# Patient Record
Sex: Female | Born: 1935 | Race: White | Hispanic: No | Marital: Married | Smoking: Never smoker
Health system: Southern US, Community
[De-identification: ages and names within clinical notes are randomized; demographics above are authoritative.]

## PROBLEM LIST (undated history)

## (undated) DIAGNOSIS — E785 Hyperlipidemia, unspecified: Secondary | ICD-10-CM

## (undated) DIAGNOSIS — E039 Hypothyroidism, unspecified: Secondary | ICD-10-CM

## (undated) DIAGNOSIS — I639 Cerebral infarction, unspecified: Secondary | ICD-10-CM

## (undated) DIAGNOSIS — F0390 Unspecified dementia without behavioral disturbance: Secondary | ICD-10-CM

## (undated) HISTORY — PX: TOTAL HIP ARTHROPLASTY: SHX124

---

## 2000-03-03 ENCOUNTER — Encounter: Payer: Self-pay | Admitting: Internal Medicine

## 2000-03-03 ENCOUNTER — Encounter: Admission: RE | Admit: 2000-03-03 | Discharge: 2000-03-03 | Payer: Self-pay | Admitting: Internal Medicine

## 2000-03-16 ENCOUNTER — Other Ambulatory Visit: Admission: RE | Admit: 2000-03-16 | Discharge: 2000-03-16 | Payer: Self-pay | Admitting: Internal Medicine

## 2000-07-21 ENCOUNTER — Encounter: Payer: Self-pay | Admitting: Orthopaedic Surgery

## 2000-07-21 ENCOUNTER — Inpatient Hospital Stay (HOSPITAL_COMMUNITY): Admission: RE | Admit: 2000-07-21 | Discharge: 2000-07-27 | Payer: Self-pay | Admitting: Orthopaedic Surgery

## 2000-07-23 ENCOUNTER — Encounter: Payer: Self-pay | Admitting: Orthopaedic Surgery

## 2000-08-18 ENCOUNTER — Inpatient Hospital Stay (HOSPITAL_COMMUNITY): Admission: AD | Admit: 2000-08-18 | Discharge: 2000-08-22 | Payer: Self-pay | Admitting: Orthopaedic Surgery

## 2001-03-05 ENCOUNTER — Encounter: Admission: RE | Admit: 2001-03-05 | Discharge: 2001-03-05 | Payer: Self-pay | Admitting: Internal Medicine

## 2001-03-05 ENCOUNTER — Encounter: Payer: Self-pay | Admitting: Internal Medicine

## 2002-02-15 ENCOUNTER — Encounter: Payer: Self-pay | Admitting: Internal Medicine

## 2002-02-15 ENCOUNTER — Encounter: Admission: RE | Admit: 2002-02-15 | Discharge: 2002-02-15 | Payer: Self-pay | Admitting: Internal Medicine

## 2002-03-29 ENCOUNTER — Other Ambulatory Visit: Admission: RE | Admit: 2002-03-29 | Discharge: 2002-03-29 | Payer: Self-pay | Admitting: Internal Medicine

## 2003-02-24 ENCOUNTER — Encounter: Admission: RE | Admit: 2003-02-24 | Discharge: 2003-02-24 | Payer: Self-pay | Admitting: Internal Medicine

## 2003-02-24 ENCOUNTER — Encounter: Payer: Self-pay | Admitting: Internal Medicine

## 2003-03-16 ENCOUNTER — Other Ambulatory Visit: Admission: RE | Admit: 2003-03-16 | Discharge: 2003-03-16 | Payer: Self-pay | Admitting: Internal Medicine

## 2003-05-24 ENCOUNTER — Encounter: Payer: Self-pay | Admitting: *Deleted

## 2003-05-24 ENCOUNTER — Ambulatory Visit (HOSPITAL_COMMUNITY): Admission: RE | Admit: 2003-05-24 | Discharge: 2003-05-24 | Payer: Self-pay | Admitting: *Deleted

## 2004-02-26 ENCOUNTER — Ambulatory Visit (HOSPITAL_COMMUNITY): Admission: RE | Admit: 2004-02-26 | Discharge: 2004-02-26 | Payer: Self-pay | Admitting: Internal Medicine

## 2005-04-04 ENCOUNTER — Encounter: Admission: RE | Admit: 2005-04-04 | Discharge: 2005-04-04 | Payer: Self-pay | Admitting: Internal Medicine

## 2006-05-28 ENCOUNTER — Inpatient Hospital Stay (HOSPITAL_COMMUNITY): Admission: EM | Admit: 2006-05-28 | Discharge: 2006-05-30 | Payer: Self-pay | Admitting: Emergency Medicine

## 2006-05-28 ENCOUNTER — Ambulatory Visit (HOSPITAL_COMMUNITY): Admission: RE | Admit: 2006-05-28 | Discharge: 2006-05-28 | Payer: Self-pay | Admitting: Internal Medicine

## 2006-05-29 ENCOUNTER — Ambulatory Visit: Payer: Self-pay | Admitting: Cardiology

## 2006-06-01 ENCOUNTER — Ambulatory Visit: Payer: Self-pay | Admitting: Cardiology

## 2006-06-01 ENCOUNTER — Ambulatory Visit (HOSPITAL_COMMUNITY): Admission: RE | Admit: 2006-06-01 | Discharge: 2006-06-01 | Payer: Self-pay | Admitting: Cardiology

## 2006-06-03 ENCOUNTER — Encounter: Admission: RE | Admit: 2006-06-03 | Discharge: 2006-06-29 | Payer: Self-pay | Admitting: Nurse Practitioner

## 2006-06-06 ENCOUNTER — Inpatient Hospital Stay (HOSPITAL_COMMUNITY): Admission: EM | Admit: 2006-06-06 | Discharge: 2006-06-07 | Payer: Self-pay | Admitting: Emergency Medicine

## 2007-03-17 ENCOUNTER — Encounter: Admission: RE | Admit: 2007-03-17 | Discharge: 2007-03-17 | Payer: Self-pay | Admitting: Neurology

## 2011-01-05 ENCOUNTER — Encounter: Payer: Self-pay | Admitting: Internal Medicine

## 2011-01-30 ENCOUNTER — Emergency Department (HOSPITAL_COMMUNITY): Payer: Medicare Other

## 2011-01-30 ENCOUNTER — Observation Stay (HOSPITAL_COMMUNITY)
Admission: EM | Admit: 2011-01-30 | Discharge: 2011-01-31 | Disposition: A | Discharge status: Home / Self Care | Payer: Medicare Other | Attending: Internal Medicine | Admitting: Internal Medicine

## 2011-01-30 DIAGNOSIS — G309 Alzheimer's disease, unspecified: Secondary | ICD-10-CM | POA: Insufficient documentation

## 2011-01-30 DIAGNOSIS — S0100XA Unspecified open wound of scalp, initial encounter: Secondary | ICD-10-CM | POA: Insufficient documentation

## 2011-01-30 DIAGNOSIS — I69920 Aphasia following unspecified cerebrovascular disease: Secondary | ICD-10-CM | POA: Insufficient documentation

## 2011-01-30 DIAGNOSIS — E039 Hypothyroidism, unspecified: Secondary | ICD-10-CM | POA: Insufficient documentation

## 2011-01-30 DIAGNOSIS — E785 Hyperlipidemia, unspecified: Secondary | ICD-10-CM | POA: Insufficient documentation

## 2011-01-30 DIAGNOSIS — R55 Syncope and collapse: Principal | ICD-10-CM | POA: Insufficient documentation

## 2011-01-30 DIAGNOSIS — Z79899 Other long term (current) drug therapy: Secondary | ICD-10-CM | POA: Insufficient documentation

## 2011-01-30 DIAGNOSIS — F0280 Dementia in other diseases classified elsewhere without behavioral disturbance: Secondary | ICD-10-CM | POA: Insufficient documentation

## 2011-01-30 DIAGNOSIS — N390 Urinary tract infection, site not specified: Secondary | ICD-10-CM | POA: Insufficient documentation

## 2011-01-30 DIAGNOSIS — Y92009 Unspecified place in unspecified non-institutional (private) residence as the place of occurrence of the external cause: Secondary | ICD-10-CM | POA: Insufficient documentation

## 2011-01-30 DIAGNOSIS — W19XXXA Unspecified fall, initial encounter: Secondary | ICD-10-CM | POA: Insufficient documentation

## 2011-01-30 DIAGNOSIS — Z96649 Presence of unspecified artificial hip joint: Secondary | ICD-10-CM | POA: Insufficient documentation

## 2011-01-30 LAB — COMPREHENSIVE METABOLIC PANEL
AST: 28 U/L (ref 0–37)
Albumin: 4 g/dL (ref 3.5–5.2)
Alkaline Phosphatase: 60 U/L (ref 39–117)
BUN: 10 mg/dL (ref 6–23)
CO2: 27 mEq/L (ref 19–32)
Chloride: 108 mEq/L (ref 96–112)
GFR calc Af Amer: >60 mL/min (ref >60)
GFR calc non Af Amer: >60 mL/min (ref >60)
Glucose, Bld: 108 mg/dL — ABNORMAL HIGH (ref 70–99)
Potassium: 4 mEq/L (ref 3.5–5.1)
Sodium: 145 mEq/L (ref 135–145)
Total Protein: 6.9 g/dL (ref 6.0–8.3)

## 2011-01-30 LAB — URINE MICROSCOPIC-ADD ON

## 2011-01-30 LAB — URINALYSIS, ROUTINE W REFLEX MICROSCOPIC
Bilirubin Urine: NEGATIVE
Hgb urine dipstick: NEGATIVE
Protein, ur: NEGATIVE mg/dL
Specific Gravity, Urine: 1.013 (ref 1.005–1.030)
Urine Glucose, Fasting: NEGATIVE mg/dL
pH: 5 (ref 5.0–8.0)

## 2011-01-30 LAB — DIFFERENTIAL
Basophils Relative: 0 % (ref 0–1)
Eosinophils Absolute: 0 10*3/uL (ref 0.0–0.7)
Eosinophils Relative: 0 % (ref 0–5)
Lymphocytes Relative: 12 % (ref 12–46)
Lymphs Abs: 1.5 10*3/uL (ref 0.7–4.0)
Monocytes Absolute: 0.7 10*3/uL (ref 0.1–1.0)
Neutro Abs: 9.9 10*3/uL — ABNORMAL HIGH (ref 1.7–7.7)
Neutrophils Relative %: 82 % — ABNORMAL HIGH (ref 43–77)

## 2011-01-30 LAB — CBC
HCT: 38.4 % (ref 36.0–46.0)
MCH: 32.7 pg (ref 26.0–34.0)
MCHC: 33.1 g/dL (ref 30.0–36.0)
RDW: 12.6 % (ref 11.5–15.5)
WBC: 12.1 10*3/uL — ABNORMAL HIGH (ref 4.0–10.5)

## 2011-01-30 LAB — BASIC METABOLIC PANEL
Calcium: 9.3 mg/dL (ref 8.4–10.5)
Potassium: 3.8 mEq/L (ref 3.5–5.1)
Sodium: 144 mEq/L (ref 135–145)

## 2011-01-30 LAB — PROTIME-INR
INR: 0.93 (ref 0.00–1.49)
Prothrombin Time: 12.7 seconds (ref 11.6–15.2)

## 2011-01-30 LAB — TSH: TSH: 1.405 u[IU]/mL (ref 0.350–4.500)

## 2011-01-30 LAB — GLUCOSE, CAPILLARY: Glucose-Capillary: 105 mg/dL — ABNORMAL HIGH (ref 70–99)

## 2011-01-31 ENCOUNTER — Other Ambulatory Visit (HOSPITAL_COMMUNITY): Payer: Medicare Other

## 2011-01-31 DIAGNOSIS — R55 Syncope and collapse: Secondary | ICD-10-CM

## 2011-01-31 LAB — GLUCOSE, CAPILLARY: Glucose-Capillary: 105 mg/dL — ABNORMAL HIGH (ref 70–99)

## 2011-02-01 LAB — URINE CULTURE: Culture  Setup Time: 201202170146

## 2011-02-03 NOTE — H&P (Signed)
[name], [place]                ACCOUNT NO.:  [id number]  MEDICAL RECORD NO.:  [id number]           PATIENT TYPE:  I  LOCATION:  1426                         FACILITY:  [facility]  PHYSICIAN:  Hillery Aldo, M.D.   DATE OF BIRTH:  [birth year]  DATE OF ADMISSION:  01/30/2011 DATE OF DISCHARGE:                             HISTORY & PHYSICAL   PRIMARY CARE PHYSICIAN:  Massie Maroon, MD  CHIEF COMPLAINT:  Fall with head trauma.  HISTORY OF PRESENT ILLNESS:  The patient is an 77 year old female with past medical history of a left parietal stroke and Alzheimer dementia who presents to the hospital after suffering a syncopal episode.  The patient actually has no memory of the episode or the subsequent fall. She cannot say whether she had any symptoms prior to falling and it is unclear at this time if this was a mechanical fall or a syncopal episode.  The patient's husband heard a noise from the bathroom and found her wandering around.  She was unaware that she had fallen, but noticed that she was bleeding from the left side of her head.  The patient had a significant scalp laceration and was subsequently brought to the hospital for evaluation by her husband.  She is currently denying any dizziness, headache, speech changes, dysphagia, double vision, or focal neurologic deficits.  She also denies chest pain, dyspnea, nausea, vomiting, diarrhea, dysuria.  She has not noticed any increase in fatigue lately.  Upon initial evaluation in the emergency department, the scalp laceration was repaired with staples and she was referred to the hospitalist service for further inpatient evaluation.  PAST MEDICAL HISTORY: 1. Left parietal stroke with a history of aphasia. 2. Alzheimer dementia. 3. Dyslipidemia. 4. Hypothyroidism.  PAST SURGICAL HISTORY: 1. Right total hip replacement in 2001. 2. Hysterectomy in 1992. 3. Goiter surgery in 1977. 4. Status post bilateral cataract extractions with  lens implants.  FAMILY HISTORY:  The patient's parents died in their 31s of unknown causes.  She has 2 siblings who have had strokes.  Several of her siblings are deceased from cancer.  She has 2 daughters who have had breast cancer.  SOCIAL HISTORY:  The patient is married and lives with her husband.  She is a Advertising account planner.  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  She denies any history of tobacco, alcohol, or drug use.  ALLERGIES:  No known drug allergies.  CURRENT MEDICATIONS: 1. Aricept 23 mg p.o. daily. 2. Levothyroxine 0.25 mcg p.o. daily. 3. Aggrenox 1 capsule p.o. b.i.d. 4. Namenda 10 mg p.o. b.i.d. 5. Simvastatin 20 mg p.o. daily. 6. Meclizine 25 mg p.o. q.6 h. p.r.n. dizziness.  REVIEW OF SYSTEMS:  Comprehensive 14-point review of systems is completed and is noted in the elements of the HPI above, otherwise negative.  PHYSICAL EXAMINATION:  VITAL SIGNS:  Temperature 97.5, pulse 78, respirations 18, blood pressure 168/72, O2 saturation 96% on room air. GENERAL:  A frail elderly female in no acute distress. HEENT:  Normocephalic.  She has a large scalp laceration on the left side of the parietal skull with  dried blood matting   her hair down on that side.  PERRL with lens implants noted bilaterally.  EOMI. Oropharynx is clear.  Tongue is midline.  Palate rises symmetrically. NECK:  Supple.  No thyromegaly.  No lymphadenopathy.  No jugular venous distention.  Scar from thyroid surgery noted. CHEST:  Lungs clear to auscultation bilaterally with good air movement. HEART:  Regular rate, rhythm.  No murmurs, rubs, or gallops. ABDOMEN:  Soft, nontender, nondistended with normoactive bowel sounds. EXTREMITIES:  No clubbing, edema, cyanosis. SKIN:  Warm and dry.  Scalp laceration approximately 1.5 cm.  Her staples are intact as noted above.  NEUROLOGIC:  The patient is alert and oriented x2.  Cranial nerves II through XII are grossly intact.  She moves  all extremities x4 with equal strength.  There is no pronator drift.  Reflexes brisk and 2+ as well as symmetric.  Gait not tested.  ASSESSMENT/PLAN: 1. Syncope with fall:  At this time, it is unclear if the patient     truly suffered a syncopal event or just has memory impairment from     the mechanical fall.  Nevertheless, we will admit her for 23-hour     observation and given her history of strokes in the past, will rule     out a CVA with MRI/MRA of the brain.  Her last 2-dimensional     echocardiogram and carotid Dopplers were done in 2007, so we will     repeat these as well.  The patient does not have any evidence of     cardiac dysfunction, but we will monitor on telemetry.  One set of     cardiac enzymes is negative and since she is asymptomatic and     without chest pain, we will not repeat these.  We will check her     thyroid function and orthostatics as well.  We will check a urine     for culture, given that she does have some pyuria and bacteriuria     even though she is asymptomatic. 2. Urinary tract infection:  We will send the urine for culture and     start her empirically on Cipro 250 mg p.o. b.i.d. 3. History of stroke:  Continue the patient's Aggrenox and work her up     further as noted above. 4. Alzheimer dementia:  Continue the patient's Aricept and Namenda. 5. Dyslipidemia:  Check a fasting lipid profile in the morning to make     sure she is well controlled and continue her statin. 6. Hypothyroidism:  Check the patient's thyroid-stimulating hormone     and continue her on her regular dose of Synthroid. 7. Prophylaxis:  We will initiate Lovenox for deep venous thrombosis     prophylaxis. 8. Scalp laceration:  The patient has staples, we will provide local     wound care while in the hospital.  Time spent on admission including face-to-face time equals approximately 1 hour.     Hillery Aldo, M.D.     CR/MEDQ  D:  01/30/2011  T:  01/31/2011  Job:   [id number]  cc:   Massie Maroon, MD Fax: [phone]  Electronically Signed by Hillery Aldo M.D. on 02/03/2011 04:19:58 PM

## 2011-02-03 NOTE — Discharge Summary (Signed)
NAMECAYLEIGH, [name]                ACCOUNT NO.:  [id number]  MEDICAL RECORD NO.:  [id number]           PATIENT TYPE:  I  LOCATION:  1426                         FACILITY:  [facility]  PHYSICIAN:  Hillery Aldo, M.D.   DATE OF BIRTH:  1927-09-25  DATE OF ADMISSION:  01/30/2011 DATE OF DISCHARGE:                              DISCHARGE SUMMARY   PRIMARY CARE PHYSICIAN:  Massie Maroon, M.D.  DISCHARGE DIAGNOSES: 1. Syncope with fall. 2. Left-sided scalp laceration, status post staples. 3. Asymptomatic urinary tract infection. 4. History of stroke. 5. Alzheimer dementia. 6. Dyslipidemia. 7. Hypothyroidism.  DISCHARGE MEDICATIONS: 1. Aggrenox 25/200 one capsule p.o. b.i.d. 2. Aricept 23 mg p.o. daily. 3. Levothyroxine 25 mcg p.o. daily. 4. Meclizine 25 mg p.o. q.6h. p.r.n. dizziness. 5. Namenda 10 mg p.o. b.i.d. 6. Simvastatin 20 mg p.o. daily. 7. Tylenol PM 1 tablet p.o. at bedtime.  CONSULTATIONS:  None.  BRIEF ADMISSION HPI:  The patient is an 77 year old female with past medical history of CVA and Alzheimer dementia who suffered a fall at the home.  It is unclear if she was truly syncopal or if she had a mechanical fall as the patient does have advanced Alzheimer dementia and was unable to recall having fallen.  Nevertheless, she was brought to the hospital for evaluation by her husband after he discovered she had a bleeding scalp laceration.  The patient was subsequently referred to the hospitalist service for inpatient evaluation.  For the full details, please see my dictated H and P.  HOSPITAL COURSE BY PROBLEM: 1. Questionable syncope versus mechanical fall.  The patient was in     normal sinus rhythm throughout the night on telemetry.  We have     been unable to obtain an MRI scan due to the fact that she has     staples in her scalp at the present time.  Carotid Dopplers were     negative, and a 2-dimensional echocardiogram was unremarkable.  She     was not found to  be orthostatic.  At this time, it is most likely     that her fall was mechanical in nature and we will set her up with     home physical and occupational therapy. 2. Asymptomatic urinary tract infection.  The patient did have some     bacteriuria and pyuria on urinalysis.  Urine cultures are pending     at this time.  She was given Cipro 250 mg but we will not send her     home on any antibiotic therapy as this likely is asymptomatic and     does not need to be treated. 3. History of stroke.  The patient will be discharged on her usual     therapy with Aggrenox. 4. Alzheimer dementia.  The patient will be continued on Aricept and     Namenda. 5. Dyslipidemia.  The patient's fasting lipid profile is pending at     the time of this dictation.  She is being treated with statin     therapy which she should continue at discharge. 6. Hypothyroidism.  The patient's  TSH was checked and found to be     within the normal range.  She will continue on her usual dose of     Synthroid. 7. Left-sided scalp laceration.  The patient was provided with local     wound care and staples were applied by the emergency department     physicians.  She is instructed to return to Dr. Elmyra Ricks office in 1     week for staple removal and to follow up with him for any signs of     infection.  DISPOSITION:  The patient is medically stable and will be discharged home.  CONDITION ON DISCHARGE:  Stable.  TIME SPENT:  Coordinating care for discharge and discharge instructions including face-to-face time equals 35 minutes.     Hillery Aldo, M.D.     CR/MEDQ  D:  01/31/2011  T:  01/31/2011  Job:  [id number]  cc:   Massie Maroon, MD Fax: [phone]  Electronically Signed by Hillery Aldo M.D. on 02/03/2011 04:20:08 PM

## 2013-02-04 ENCOUNTER — Emergency Department (HOSPITAL_COMMUNITY): Payer: Medicare Other

## 2013-02-04 ENCOUNTER — Inpatient Hospital Stay (HOSPITAL_COMMUNITY)
Admission: EM | Admit: 2013-02-04 | Discharge: 2013-02-08 | DRG: 202 | Disposition: A | Discharge status: Skilled Nursing Facility | Payer: Medicare Other | Attending: Internal Medicine | Admitting: Internal Medicine

## 2013-02-04 ENCOUNTER — Encounter (HOSPITAL_COMMUNITY): Payer: Self-pay | Admitting: Emergency Medicine

## 2013-02-04 DIAGNOSIS — E785 Hyperlipidemia, unspecified: Secondary | ICD-10-CM | POA: Diagnosis present

## 2013-02-04 DIAGNOSIS — R5381 Other malaise: Secondary | ICD-10-CM

## 2013-02-04 DIAGNOSIS — E039 Hypothyroidism, unspecified: Secondary | ICD-10-CM | POA: Diagnosis present

## 2013-02-04 DIAGNOSIS — D72829 Elevated white blood cell count, unspecified: Secondary | ICD-10-CM | POA: Diagnosis present

## 2013-02-04 DIAGNOSIS — N390 Urinary tract infection, site not specified: Secondary | ICD-10-CM | POA: Diagnosis present

## 2013-02-04 DIAGNOSIS — Z96649 Presence of unspecified artificial hip joint: Secondary | ICD-10-CM

## 2013-02-04 DIAGNOSIS — Z8673 Personal history of transient ischemic attack (TIA), and cerebral infarction without residual deficits: Secondary | ICD-10-CM

## 2013-02-04 DIAGNOSIS — J189 Pneumonia, unspecified organism: Secondary | ICD-10-CM

## 2013-02-04 DIAGNOSIS — Z7982 Long term (current) use of aspirin: Secondary | ICD-10-CM

## 2013-02-04 DIAGNOSIS — R5383 Other fatigue: Secondary | ICD-10-CM

## 2013-02-04 DIAGNOSIS — F0390 Unspecified dementia without behavioral disturbance: Secondary | ICD-10-CM | POA: Diagnosis present

## 2013-02-04 DIAGNOSIS — J209 Acute bronchitis, unspecified: Principal | ICD-10-CM | POA: Diagnosis present

## 2013-02-04 DIAGNOSIS — R259 Unspecified abnormal involuntary movements: Secondary | ICD-10-CM | POA: Diagnosis present

## 2013-02-04 DIAGNOSIS — R531 Weakness: Secondary | ICD-10-CM | POA: Diagnosis present

## 2013-02-04 HISTORY — DX: Unspecified dementia, unspecified severity, without behavioral disturbance, psychotic disturbance, mood disturbance, and anxiety: F03.90

## 2013-02-04 HISTORY — DX: Cerebral infarction, unspecified: I63.9

## 2013-02-04 HISTORY — DX: Hyperlipidemia, unspecified: E78.5

## 2013-02-04 HISTORY — DX: Hypothyroidism, unspecified: E03.9

## 2013-02-04 LAB — CREATININE, SERUM
Creatinine, Ser: 0.73 mg/dL (ref 0.50–1.10)
GFR calc Af Amer: 89 mL/min — ABNORMAL LOW (ref >90)

## 2013-02-04 LAB — CBC
Platelets: 197 10*3/uL (ref 150–400)
RBC: 3.67 MIL/uL — ABNORMAL LOW (ref 3.87–5.11)
RDW: 12.8 % (ref 11.5–15.5)
WBC: 15 10*3/uL — ABNORMAL HIGH (ref 4.0–10.5)

## 2013-02-04 LAB — CBC WITH DIFFERENTIAL/PLATELET
Eosinophils Absolute: 0 10*3/uL (ref 0.0–0.7)
Eosinophils Relative: 0 % (ref 0–5)
Hemoglobin: 12.2 g/dL (ref 12.0–15.0)
Lymphs Abs: 1.4 10*3/uL (ref 0.7–4.0)
MCH: 32.1 pg (ref 26.0–34.0)
MCV: 96.8 fL (ref 78.0–100.0)
Monocytes Absolute: 2.4 10*3/uL — ABNORMAL HIGH (ref 0.1–1.0)
Monocytes Relative: 13 % — ABNORMAL HIGH (ref 3–12)
Platelets: 232 10*3/uL (ref 150–400)
RBC: 3.8 MIL/uL — ABNORMAL LOW (ref 3.87–5.11)

## 2013-02-04 LAB — BASIC METABOLIC PANEL
BUN: 18 mg/dL (ref 6–23)
Calcium: 9.9 mg/dL (ref 8.4–10.5)
Creatinine, Ser: 1.02 mg/dL (ref 0.50–1.10)
GFR calc non Af Amer: 49 mL/min — ABNORMAL LOW (ref >90)
Glucose, Bld: 119 mg/dL — ABNORMAL HIGH (ref 70–99)

## 2013-02-04 LAB — URINALYSIS, ROUTINE W REFLEX MICROSCOPIC
Bilirubin Urine: NEGATIVE
Glucose, UA: NEGATIVE mg/dL
Ketones, ur: NEGATIVE mg/dL
pH: 6 (ref 5.0–8.0)

## 2013-02-04 LAB — URINE MICROSCOPIC-ADD ON

## 2013-02-04 MED ORDER — OXYCODONE HCL 5 MG PO TABS: 5.0000 mg | ORAL_TABLET | ORAL | Status: DC | PRN

## 2013-02-04 MED ORDER — ALUM & MAG HYDROXIDE-SIMETH 200-200-20 MG/5ML PO SUSP: 30.0000 mL | Freq: Four times a day (QID) | ORAL | Status: DC | PRN

## 2013-02-04 MED ORDER — ACETAMINOPHEN 325 MG PO TABS: 650.0000 mg | ORAL_TABLET | Freq: Four times a day (QID) | ORAL | Status: DC | PRN

## 2013-02-04 MED ORDER — SODIUM CHLORIDE 0.9 % IV BOLUS (SEPSIS)
1000.0000 mL | Freq: Once | INTRAVENOUS | Status: AC
  Administered 2013-02-04: 1000 mL via INTRAVENOUS

## 2013-02-04 MED ORDER — ONDANSETRON HCL 4 MG PO TABS: 4.0000 mg | ORAL_TABLET | Freq: Four times a day (QID) | ORAL | Status: DC | PRN

## 2013-02-04 MED ORDER — LEVOTHYROXINE SODIUM 25 MCG PO TABS
25.0000 ug | ORAL_TABLET | Freq: Every day | ORAL | Status: DC
  Administered 2013-02-05 – 2013-02-08 (×4): 25 ug via ORAL
  Filled 2013-02-04 (×5): qty 1

## 2013-02-04 MED ORDER — ALBUTEROL SULFATE (5 MG/ML) 0.5% IN NEBU: 2.5000 mg | INHALATION_SOLUTION | Freq: Four times a day (QID) | RESPIRATORY_TRACT | Status: DC

## 2013-02-04 MED ORDER — ASPIRIN-DIPYRIDAMOLE ER 25-200 MG PO CP12
1.0000 | ORAL_CAPSULE | Freq: Two times a day (BID) | ORAL | Status: DC
  Administered 2013-02-04 – 2013-02-08 (×8): 1 via ORAL
  Filled 2013-02-04 (×9): qty 1

## 2013-02-04 MED ORDER — ONDANSETRON HCL 4 MG/2ML IJ SOLN: 4.0000 mg | Freq: Four times a day (QID) | INTRAMUSCULAR | Status: DC | PRN

## 2013-02-04 MED ORDER — SIMVASTATIN 20 MG PO TABS
20.0000 mg | ORAL_TABLET | Freq: Every day | ORAL | Status: DC
  Administered 2013-02-04 – 2013-02-07 (×4): 20 mg via ORAL
  Filled 2013-02-04 (×5): qty 1

## 2013-02-04 MED ORDER — HYDROMORPHONE HCL PF 1 MG/ML IJ SOLN: 0.5000 mg | INTRAMUSCULAR | Status: DC | PRN

## 2013-02-04 MED ORDER — LEVOFLOXACIN IN D5W 750 MG/150ML IV SOLN
750.0000 mg | Freq: Once | INTRAVENOUS | Status: AC
  Administered 2013-02-04: 750 mg via INTRAVENOUS
  Filled 2013-02-04: qty 150

## 2013-02-04 MED ORDER — ZOLPIDEM TARTRATE 5 MG PO TABS: 5.0000 mg | ORAL_TABLET | Freq: Every evening | ORAL | Status: DC | PRN

## 2013-02-04 MED ORDER — ALBUTEROL SULFATE (5 MG/ML) 0.5% IN NEBU: 2.5000 mg | INHALATION_SOLUTION | RESPIRATORY_TRACT | Status: DC | PRN

## 2013-02-04 MED ORDER — PRIMIDONE 50 MG PO TABS
50.0000 mg | ORAL_TABLET | Freq: Every day | ORAL | Status: DC
  Administered 2013-02-04 – 2013-02-07 (×4): 50 mg via ORAL
  Filled 2013-02-04 (×5): qty 1

## 2013-02-04 MED ORDER — ENOXAPARIN SODIUM 40 MG/0.4ML ~~LOC~~ SOLN
40.0000 mg | SUBCUTANEOUS | Status: DC
  Administered 2013-02-04 – 2013-02-07 (×4): 40 mg via SUBCUTANEOUS
  Filled 2013-02-04 (×5): qty 0.4

## 2013-02-04 MED ORDER — ACETAMINOPHEN 650 MG RE SUPP: 650.0000 mg | Freq: Four times a day (QID) | RECTAL | Status: DC | PRN

## 2013-02-04 MED ORDER — SODIUM CHLORIDE 0.9 % IV SOLN
INTRAVENOUS | Status: AC
  Administered 2013-02-04: 22:00:00 via INTRAVENOUS

## 2013-02-04 MED ORDER — SODIUM CHLORIDE 0.9 % IV SOLN
INTRAVENOUS | Status: DC
  Administered 2013-02-04 – 2013-02-07 (×3): via INTRAVENOUS

## 2013-02-04 MED ORDER — DONEPEZIL HCL 10 MG PO TABS
10.0000 mg | ORAL_TABLET | Freq: Every day | ORAL | Status: DC
  Administered 2013-02-04 – 2013-02-07 (×4): 10 mg via ORAL
  Filled 2013-02-04 (×5): qty 1

## 2013-02-04 MED ORDER — LEVOFLOXACIN IN D5W 500 MG/100ML IV SOLN
500.0000 mg | INTRAVENOUS | Status: DC
  Filled 2013-02-04: qty 100

## 2013-02-04 NOTE — ED Provider Notes (Signed)
History     CSN: [id number]  Arrival date & time 02/04/13  1627   First MD Initiated Contact with Patient 02/04/13 1658      Chief Complaint  Patient presents with  . Weakness    (Consider location/radiation/quality/duration/timing/severity/associated sxs/prior treatment) The history is provided by the patient. The history is limited by the condition of the patient.  [name] is a 79 y.o. female history dementia, hypothyroidism here presenting with worsening right-sided weakness and fatigue. Over the last week husband noted that she is more fatigued and it's hard for her to get up and walk. This morning she has trouble picking up water with her right hand and she is shaking more than usual. Denies any fevers or chills or cough. Patient is incontinent at baseline.   Level V caveat- dementia    Past Medical History  Diagnosis Date  . Dementia     Past Surgical History  Procedure Laterality Date  . Total hip arthroplasty      History reviewed. No pertinent family history.  History  Substance Use Topics  . Smoking status: Never Smoker   . Smokeless tobacco: Not on file  . Alcohol Use: No    OB History   Grav Para Term Preterm Abortions TAB SAB Ect Mult Living                  Review of Systems  Neurological: Positive for weakness.  All other systems reviewed and are negative.    Allergies  Review of patient's allergies indicates no known allergies.  Home Medications   Current Outpatient Rx  Name  Route  Sig  Dispense  Refill  . dipyridamole-aspirin (AGGRENOX) 200-25 MG per 12 hr capsule   Oral   Take 1 capsule by mouth 2 (two) times daily.         Marland Kitchen donepezil (ARICEPT) 10 MG tablet   Oral   Take 10 mg by mouth at bedtime.         Marland Kitchen levothyroxine (SYNTHROID, LEVOTHROID) 25 MCG tablet   Oral   Take 25 mcg by mouth every morning.         . primidone (MYSOLINE) 50 MG tablet   Oral   Take 50 mg by mouth every evening.         .  simvastatin (ZOCOR) 20 MG tablet   Oral   Take 20 mg by mouth every evening.           BP 157/55  Pulse 94  Temp(Src) 98.2 F (36.8 C) (Oral)  SpO2 95%  Physical Exam  Nursing note and vitals reviewed. Constitutional:  Demented, pleasant, NAD   HENT:  Head: Normocephalic.  Mouth/Throat: Oropharynx is clear and moist.  Eyes: Conjunctivae are normal. Pupils are equal, round, and reactive to light.  Neck: Normal range of motion.  Cardiovascular: Normal rate, regular rhythm and normal heart sounds.   Pulmonary/Chest: Effort normal.  ? Crackles on R base   Abdominal: Soft. Bowel sounds are normal. She exhibits no distension. There is no tenderness.  Musculoskeletal: Normal range of motion.  Neurological: She is alert. No cranial nerve deficit.  + unintentional tremor bilateral hands. No pronator drift. 4/5 strength throughout. No facial droop   Skin: Skin is warm and dry.  Psychiatric: She has a normal mood and affect. Her behavior is normal. Judgment and thought content normal.    ED Course  Procedures (including critical care time)  Labs Reviewed  CBC WITH DIFFERENTIAL - Abnormal;  Notable for the following:    WBC 18.4 (*)    RBC 3.80 (*)    Neutrophils Relative 79 (*)    Neutro Abs 14.6 (*)    Lymphocytes Relative 8 (*)    Monocytes Relative 13 (*)    Monocytes Absolute 2.4 (*)    All other components within normal limits  BASIC METABOLIC PANEL - Abnormal; Notable for the following:    Glucose, Bld 119 (*)    GFR calc non Af Amer 49 (*)    GFR calc Af Amer 57 (*)    All other components within normal limits  URINALYSIS, ROUTINE W REFLEX MICROSCOPIC - Abnormal; Notable for the following:    Leukocytes, UA SMALL (*)    All other components within normal limits  URINE MICROSCOPIC-ADD ON - Abnormal; Notable for the following:    Squamous Epithelial / LPF FEW (*)    All other components within normal limits  URINE CULTURE  PROTIME-INR   Dg Chest 2  View  02/04/2013  *RADIOLOGY REPORT*  Clinical Data: Confusion and weakness.  CHEST - 2 VIEW  Comparison: Chest x-ray 01/30/2011.  Findings: Lung volumes are low.  There are linear bibasilar opacities favored to represent subsegmental atelectasis.  No definite consolidative airspace disease.  Mild diffuse interstitial prominence and peribronchial cuffing.  No pleural effusions. Pulmonary vasculature is within normal limits.  Heart size is normal.  Upper mediastinal contours are unremarkable. Atherosclerosis in the thoracic aorta.  Bilateral apical pleuroparenchymal thickening with probable pleural calcifications in the left apex, similar to prior studies, most compatible with chronic pleural parenchymal scarring.  IMPRESSION: 1.  Mild diffuse interstitial prominence and peribronchial cuffing suspicious for bronchitis. 2.  Low lung volumes with bibasilar subsegmental atelectasis. 3.  Atherosclerosis.   Original Report Authenticated By: Trudie Reed, M.D.    Ct Head Wo Contrast  02/04/2013  *RADIOLOGY REPORT*  Clinical Data: Right-sided weakness.  CT HEAD WITHOUT CONTRAST  Technique:  Contiguous axial images were obtained from the base of the skull through the vertex without contrast.  Comparison: 01/30/2011.  Findings: Stable changes of cerebral atrophy, ventriculomegaly and periventricular white matter disease.  There is a remote cortical infarct in the left posterior parietal/occipital region.  No CT findings for acute hemispheric infarction and/or intracranial hemorrhage.  No extra-axial fluid collections are identified.  No mass lesions.  The brainstem and cerebellum are grossly normal and stable.  The bony structures are intact.  The paranasal sinuses and mastoid air cells are clear.  IMPRESSION:  1.  Stable age related cerebral atrophy, ventriculomegaly and periventricular white matter disease. 2.  Remote left posterior parietal/occipital infarct. 3.  No new/acute intracranial findings.   Original Report  Authenticated By: Rudie Meyer, M.D.      No diagnosis found.    MDM  [name] is a 79 y.o. female here with weakness. Will r/o sepsis vs stroke. Will get labs, CT head, UA, CXR and reassess.   7:55 PM WBC 18, UA possible UTI. CXR showed bibasilar atelectasis. But i heard crackles more on R base. Will give levaquin for pneumonia and UTI. Will admit for observation. I discussed with Dr. Lovell Sheehan, who accepted the patient on med/surg.       Richardean Canal, MD 02/04/13 [phone]

## 2013-02-04 NOTE — ED Notes (Signed)
Pt transported to XRay 

## 2013-02-04 NOTE — ED Notes (Signed)
Dr. Jenkins at bedside. 

## 2013-02-04 NOTE — H&P (Signed)
[facility] History and Physical  [name] ZOX:[id number] DOB: [birth year] DOA: 02/04/2013  Referring physician: EDP PCP: Pearson Grippe, MD  Specialists:   Chief Complaint: Weakness  HPI: [name] is a 79 y.o. female with a history of Dementia who was brought to the ED due to gradual weakness over the past 2 weeks, and today she had difficulty walking and grasping objects.  Her husband gives the history and reports that this AM she also had shaking and was tremulous especially in her right hand.  He states that she has had nasal congestion, but he does not know if she had any fevers or chills.   In the ED she was found to have a leukocytosis of 18k, and a Chest x-ray which revealed bilateral atelectasis, and a positive UA.  She was started on IV Levaquin to cover both.      Review of Systems: Unable to Obtain from the Patient  Past Medical History  Diagnosis Date  . Dementia   . Hypothyroid   . Hyperlipidemia   . CVA (cerebral infarction)    Past Surgical History  Procedure Laterality Date  . Total hip arthroplasty      Right    Medications:  HOME MEDS: Prior to Admission medications   Medication Sig Start Date End Date Taking? Authorizing Provider  dipyridamole-aspirin (AGGRENOX) 200-25 MG per 12 hr capsule Take 1 capsule by mouth 2 (two) times daily.   Yes Historical Provider, MD  donepezil (ARICEPT) 10 MG tablet Take 10 mg by mouth at bedtime.   Yes Historical Provider, MD  levothyroxine (SYNTHROID, LEVOTHROID) 25 MCG tablet Take 25 mcg by mouth every morning.   Yes Historical Provider, MD  primidone (MYSOLINE) 50 MG tablet Take 50 mg by mouth every evening.   Yes Historical Provider, MD  simvastatin (ZOCOR) 20 MG tablet Take 20 mg by mouth every evening.   Yes Historical Provider, MD    Allergies:  No Known Allergies  Social History:  Married  reports that she has never smoked. She does not have any smokeless tobacco history on file. She does not drink  alcohol or use illicit drugs.  Family History:       Husband Does not know her Family History             Physical Exam:  GEN:  Pleasant 79 year old Elderly thin Caucasian Female examined  and in no acute distress; cooperative with exam Filed Vitals:   02/04/13 1631  BP: 157/55  Pulse: 94  Temp: 98.2 F (36.8 C)  TempSrc: Oral  SpO2: 95%   Blood pressure 157/55, pulse 94, temperature 98.2 F (36.8 C), temperature source Oral, SpO2 95.00%. PSYCH: She is alert and oriented x  1; does not appear anxious does not appear depressed; affect is normal HEENT: Normocephalic and Atraumatic, Mucous membranes pink; PERRLA; EOM intact; Fundi:  Benign;  No scleral icterus, Nares: Patent, Oropharynx: Clear, Fair Dentition, Neck:  FROM, no cervical lymphadenopathy nor thyromegaly or carotid bruit; no JVD; Breasts:: Not examined CHEST WALL: No tenderness CHEST: Normal respiration, clear to auscultation bilaterally HEART: Regular rate and rhythm; no murmurs rubs or gallops BACK: No kyphosis or scoliosis; no CVA tenderness ABDOMEN: Positive Bowel Sounds, soft non-tender; no masses, no organomegaly. Rectal Exam: Not done EXTREMITIES: No bone or joint deformity; age-appropriate arthropathy of the hands and knees; no cyanosis, clubbing or edema; no ulcerations. Genitalia: not examined PULSES: 2+ and symmetric SKIN: Normal hydration no rash or ulceration CNS: Cranial  nerves 2-12 grossly intact no focal neurologic deficit   Labs on Admission:  Basic Metabolic Panel:  Recent Labs Lab 02/04/13 1645  NA 136  K 4.3  CL 98  CO2 28  GLUCOSE 119*  BUN 18  CREATININE 1.02  CALCIUM 9.9   Liver Function Tests: No results found for this basename: AST, ALT, ALKPHOS, BILITOT, PROT, ALBUMIN,  in the last 168 hours No results found for this basename: LIPASE, AMYLASE,  in the last 168 hours No results found for this basename: AMMONIA,  in the last 168 hours CBC:  Recent Labs Lab 02/04/13 1645  WBC  18.4*  NEUTROABS 14.6*  HGB 12.2  HCT 36.8  MCV 96.8  PLT 232   Cardiac Enzymes: No results found for this basename: CKTOTAL, CKMB, CKMBINDEX, TROPONINI,  in the last 168 hours  BNP (last 3 results) No results found for this basename: PROBNP,  in the last 8760 hours CBG: No results found for this basename: GLUCAP,  in the last 168 hours  Radiological Exams on Admission: Dg Chest 2 View  02/04/2013  *RADIOLOGY REPORT*  Clinical Data: Confusion and weakness.  CHEST - 2 VIEW  Comparison: Chest x-ray 01/30/2011.  Findings: Lung volumes are low.  There are linear bibasilar opacities favored to represent subsegmental atelectasis.  No definite consolidative airspace disease.  Mild diffuse interstitial prominence and peribronchial cuffing.  No pleural effusions. Pulmonary vasculature is within normal limits.  Heart size is normal.  Upper mediastinal contours are unremarkable. Atherosclerosis in the thoracic aorta.  Bilateral apical pleuroparenchymal thickening with probable pleural calcifications in the left apex, similar to prior studies, most compatible with chronic pleural parenchymal scarring.  IMPRESSION: 1.  Mild diffuse interstitial prominence and peribronchial cuffing suspicious for bronchitis. 2.  Low lung volumes with bibasilar subsegmental atelectasis. 3.  Atherosclerosis.   Original Report Authenticated By: Trudie Reed, M.D.    Ct Head Wo Contrast  02/04/2013  *RADIOLOGY REPORT*  Clinical Data: Right-sided weakness.  CT HEAD WITHOUT CONTRAST  Technique:  Contiguous axial images were obtained from the base of the skull through the vertex without contrast.  Comparison: 01/30/2011.  Findings: Stable changes of cerebral atrophy, ventriculomegaly and periventricular white matter disease.  There is a remote cortical infarct in the left posterior parietal/occipital region.  No CT findings for acute hemispheric infarction and/or intracranial hemorrhage.  No extra-axial fluid collections are  identified.  No mass lesions.  The brainstem and cerebellum are grossly normal and stable.  The bony structures are intact.  The paranasal sinuses and mastoid air cells are clear.  IMPRESSION:  1.  Stable age related cerebral atrophy, ventriculomegaly and periventricular white matter disease. 2.  Remote left posterior parietal/occipital infarct. 3.  No new/acute intracranial findings.   Original Report Authenticated By: Rudie Meyer, M.D.        Assessment/Plan Principal Problem:   Weakness generalized Active Problems:   CAP (community acquired pneumonia)   UTI (lower urinary tract infection)   Leukocytosis   Dementia   Hypothyroid   Hyperlipidemia   1.   Generalized Weakness- due to #1 and # 2, placed on IV Levaquin, and Gentle IVFs.    2.   CAP-  Placed on IV Levaquin, Nebs, and O2 PRN.     3.   UTI- covered with Levaquin, adjust per Urine Culture Results.    4.   Leukocytosis-   Due to #1 and #2,  Monitor trend.     5.   Dementia- on Aricept, stable on  meds.    6.   Hypothyroid-   On Levothyroxine, check TSH.    7.   Hyperlipidemia-  On simvastatin.        8.   Other- reconcile Home Medications.  DVT prophylaxis of Lovenox.      Code Status:    FULL CODE Family Communication:     Husband at Bedside   Disposition Plan:    Return to Home on Discharge  Time spent:  5 Minutes  Ron Parker [facility] Pager [phone]  If 7PM-7AM, please contact night-coverage [email or web address] Password [facility] 02/04/2013, 8:42 PM

## 2013-02-04 NOTE — ED Notes (Signed)
Pt from home with hx of dementia and right sided weakness starting yesterday; pt with mild weakness noted and some shaking; pt with clear speech and alert to person and place only per norm

## 2013-02-05 LAB — CBC
MCHC: 33.7 g/dL (ref 30.0–36.0)
Platelets: 212 10*3/uL (ref 150–400)
RDW: 12.8 % (ref 11.5–15.5)

## 2013-02-05 LAB — BASIC METABOLIC PANEL
BUN: 12 mg/dL (ref 6–23)
Calcium: 8.5 mg/dL (ref 8.4–10.5)
Creatinine, Ser: 0.63 mg/dL (ref 0.50–1.10)
GFR calc Af Amer: >90 mL/min (ref >90)
GFR calc non Af Amer: 81 mL/min — ABNORMAL LOW (ref >90)
Potassium: 3.5 mEq/L (ref 3.5–5.1)

## 2013-02-05 LAB — TSH: TSH: 2.894 u[IU]/mL (ref 0.350–4.500)

## 2013-02-05 MED ORDER — POTASSIUM CHLORIDE CRYS ER 20 MEQ PO TBCR
40.0000 meq | EXTENDED_RELEASE_TABLET | Freq: Once | ORAL | Status: AC
  Administered 2013-02-05: 40 meq via ORAL
  Filled 2013-02-05: qty 2

## 2013-02-05 MED ORDER — GUAIFENESIN ER 600 MG PO TB12
1200.0000 mg | ORAL_TABLET | Freq: Two times a day (BID) | ORAL | Status: DC
  Administered 2013-02-05 – 2013-02-08 (×7): 1200 mg via ORAL
  Filled 2013-02-05 (×8): qty 2

## 2013-02-05 MED ORDER — LEVOFLOXACIN IN D5W 250 MG/50ML IV SOLN
250.0000 mg | INTRAVENOUS | Status: DC
  Administered 2013-02-05: 250 mg via INTRAVENOUS
  Filled 2013-02-05 (×2): qty 50

## 2013-02-05 NOTE — Progress Notes (Signed)
[facility] PROGRESS NOTE  [name] ZOX:[id number] DOB: February 05, 1927 DOA: 02/04/2013 PCP: Pearson Grippe, MD  HPI/Subjective: Denies any pain, she is to be very confused asking about her husband, disoriented to the place and date   Assessment/Plan:  Community acquired pneumonia  -Patient placed on IV Levaquin,. -Supportive management with bronchodilators, mucolytics and expectorants.  UTI -Covered with Levaquin, adjust antibiotics according to culture results.  Generalized weakness -This is secondary to the pneumonia and the UTI.  Tremors -Doesn't seem to be primary neurological event, likely secondary to the UTI or the pneumonia. -Check TSH, no cogwheel rigidity  Code Status: Full code Family Communication:  Disposition Plan: Change to Inpatient   Consultants:  None  Procedures:  None  Antibiotics:  Levaquin   Objective: Filed Vitals:   02/04/13 2140 02/04/13 2231 02/05/13 0511 02/05/13 1010  BP: 92/61  161/80 167/50  Pulse: 89 86 85 98  Temp: 98 F (36.7 C) 98 F (36.7 C) 98.2 F (36.8 C) 97.8 F (36.6 C)  TempSrc: Oral Oral Oral Oral  Resp: 20  18 18   Height: 5\' 4"  (1.626 m)     Weight: 60.8 kg (134 lb 0.6 oz)     SpO2: 96% 97% 95% 97%    Intake/Output Summary (Last 24 hours) at 02/05/13 1157 Last data filed at 02/05/13 0945  Gross per 24 hour  Intake  737.5 ml  Output      0 ml  Net  737.5 ml   Filed Weights   02/04/13 2140  Weight: 60.8 kg (134 lb 0.6 oz)    Exam:  General: Confused, but awake and alert, she has generalized tremor HEENT: anicteric sclera, pupils reactive to light and accommodation, EOMI CVS: S1-S2 clear, no murmur rubs or gallops Chest: clear to auscultation bilaterally, no wheezing, rales or rhonchi Abdomen: soft nontender, nondistended, normal bowel sounds, no organomegaly Extremities: no cyanosis, clubbing or edema noted bilaterally Neuro: Cranial nerves II-XII intact, no focal neurological deficits  Data  Reviewed: Basic Metabolic Panel:  Recent Labs Lab 02/04/13 1645 02/04/13 2208 02/05/13 0504  NA 136  --  139  K 4.3  --  3.5  CL 98  --  103  CO2 28  --  24  GLUCOSE 119*  --  120*  BUN 18  --  12  CREATININE 1.02 0.73 0.63  CALCIUM 9.9  --  8.5   Liver Function Tests: No results found for this basename: AST, ALT, ALKPHOS, BILITOT, PROT, ALBUMIN,  in the last 168 hours No results found for this basename: LIPASE, AMYLASE,  in the last 168 hours No results found for this basename: AMMONIA,  in the last 168 hours CBC:  Recent Labs Lab 02/04/13 1645 02/04/13 2208 02/05/13 0504  WBC 18.4* 15.0* 16.8*  NEUTROABS 14.6*  --   --   HGB 12.2 11.7* 11.8*  HCT 36.8 35.5* 35.0*  MCV 96.8 96.7 96.2  PLT 232 197 212   Cardiac Enzymes: No results found for this basename: CKTOTAL, CKMB, CKMBINDEX, TROPONINI,  in the last 168 hours BNP (last 3 results) No results found for this basename: PROBNP,  in the last 8760 hours CBG: No results found for this basename: GLUCAP,  in the last 168 hours  No results found for this or any previous visit (from the past 240 hour(s)).   Studies: Dg Chest 2 View  02/04/2013  *RADIOLOGY REPORT*  Clinical Data: Confusion and weakness.  CHEST - 2 VIEW  Comparison: Chest x-ray 01/30/2011.  Findings:  Lung volumes are low.  There are linear bibasilar opacities favored to represent subsegmental atelectasis.  No definite consolidative airspace disease.  Mild diffuse interstitial prominence and peribronchial cuffing.  No pleural effusions. Pulmonary vasculature is within normal limits.  Heart size is normal.  Upper mediastinal contours are unremarkable. Atherosclerosis in the thoracic aorta.  Bilateral apical pleuroparenchymal thickening with probable pleural calcifications in the left apex, similar to prior studies, most compatible with chronic pleural parenchymal scarring.  IMPRESSION: 1.  Mild diffuse interstitial prominence and peribronchial cuffing suspicious  for bronchitis. 2.  Low lung volumes with bibasilar subsegmental atelectasis. 3.  Atherosclerosis.   Original Report Authenticated By: Trudie Reed, M.D.    Ct Head Wo Contrast  02/04/2013  *RADIOLOGY REPORT*  Clinical Data: Right-sided weakness.  CT HEAD WITHOUT CONTRAST  Technique:  Contiguous axial images were obtained from the base of the skull through the vertex without contrast.  Comparison: 01/30/2011.  Findings: Stable changes of cerebral atrophy, ventriculomegaly and periventricular white matter disease.  There is a remote cortical infarct in the left posterior parietal/occipital region.  No CT findings for acute hemispheric infarction and/or intracranial hemorrhage.  No extra-axial fluid collections are identified.  No mass lesions.  The brainstem and cerebellum are grossly normal and stable.  The bony structures are intact.  The paranasal sinuses and mastoid air cells are clear.  IMPRESSION:  1.  Stable age related cerebral atrophy, ventriculomegaly and periventricular white matter disease. 2.  Remote left posterior parietal/occipital infarct. 3.  No new/acute intracranial findings.   Original Report Authenticated By: Rudie Meyer, M.D.     Scheduled Meds: . dipyridamole-aspirin  1 capsule Oral BID  . donepezil  10 mg Oral QHS  . enoxaparin (LOVENOX) injection  40 mg Subcutaneous Q24H  . levofloxacin (LEVAQUIN) IV  500 mg Intravenous Q24H  . levothyroxine  25 mcg Oral QAC breakfast  . primidone  50 mg Oral QHS  . simvastatin  20 mg Oral q1800   Continuous Infusions: . sodium chloride 75 mL/hr at 02/04/13 2158    Principal Problem:   Weakness generalized Active Problems:   Dementia   CAP (community acquired pneumonia)   UTI (lower urinary tract infection)   Leukocytosis   Hypothyroid   Hyperlipidemia    Time spent: 35 minutes    [facility]  [facility] Pager [phone]. If 8PM-8AM, please contact night-coverage at [email or web address], password [facility] 02/05/2013,  11:57 AM  LOS: 1 day

## 2013-02-06 DIAGNOSIS — J209 Acute bronchitis, unspecified: Principal | ICD-10-CM

## 2013-02-06 LAB — BASIC METABOLIC PANEL
CO2: 22 mEq/L (ref 19–32)
Calcium: 9.1 mg/dL (ref 8.4–10.5)
GFR calc non Af Amer: 84 mL/min — ABNORMAL LOW (ref >90)
Glucose, Bld: 127 mg/dL — ABNORMAL HIGH (ref 70–99)
Potassium: 3.5 mEq/L (ref 3.5–5.1)
Sodium: 135 mEq/L (ref 135–145)

## 2013-02-06 LAB — CBC
Hemoglobin: 11.7 g/dL — ABNORMAL LOW (ref 12.0–15.0)
MCH: 31.9 pg (ref 26.0–34.0)
MCV: 94 fL (ref 78.0–100.0)
Platelets: 221 10*3/uL (ref 150–400)
RBC: 3.67 MIL/uL — ABNORMAL LOW (ref 3.87–5.11)
WBC: 20.1 10*3/uL — ABNORMAL HIGH (ref 4.0–10.5)

## 2013-02-06 LAB — URINE CULTURE

## 2013-02-06 MED ORDER — AZITHROMYCIN 500 MG PO TABS
500.0000 mg | ORAL_TABLET | Freq: Every day | ORAL | Status: DC
  Administered 2013-02-06 – 2013-02-08 (×3): 500 mg via ORAL
  Filled 2013-02-06 (×3): qty 1

## 2013-02-06 MED ORDER — DEXTROSE 5 % IV SOLN
1.0000 g | INTRAVENOUS | Status: DC
  Administered 2013-02-06 – 2013-02-08 (×3): 1 g via INTRAVENOUS
  Filled 2013-02-06 (×3): qty 10

## 2013-02-06 NOTE — Progress Notes (Signed)
[facility] PROGRESS NOTE  RONIKA KELSON ZOX:[id number] DOB: [birth year] DOA: 02/04/2013 PCP: Pearson Grippe, MD  HPI/Subjective: Denies any pain, she is to be very confused asking about her husband, disoriented to the place and date   Assessment/Plan:  Acute bronchitis -Patient placed on IV Levaquin, and I will switch her to IV Rocephin and oral azithromycin -Supportive management with bronchodilators, mucolytics and expectorants.  UTI -Antibiotics switched to Rocephin, adjust antibiotics according to culture results. -Urine culture showed Lactobacillus, previous culture showed GBS. I will obtain blood cultures.  Leukocytosis -WBC count increased from yesterday, obtain blood culture. -Repeat urine culture and change the antibiotics to Rocephin and azithromycin.  Generalized weakness -This is secondary to the pneumonia and the UTI.  Tremors -Doesn't seem to be primary neurological event, likely secondary to the UTI or the pneumonia. -Check TSH, no cogwheel rigidity  Code Status: Full code Family Communication:  Disposition Plan: Change to Inpatient   Consultants:  None  Procedures:  None  Antibiotics:  Levaquin>>> 02/23  Rocephin and azithromycin started on 02/23   Objective: Filed Vitals:   02/05/13 2215 02/06/13 0153 02/06/13 0557 02/06/13 0649  BP: 153/70 138/50 177/76 137/70  Pulse: 87 88 91 85  Temp: 98 F (36.7 C) 97.6 F (36.4 C) 98 F (36.7 C)   TempSrc: Oral Oral Oral   Resp: 20 18 20    Height:      Weight:      SpO2: 98% 95% 95% 100%    Intake/Output Summary (Last 24 hours) at 02/06/13 1121 Last data filed at 02/06/13 0700  Gross per 24 hour  Intake   1800 ml  Output      0 ml  Net   1800 ml   Filed Weights   02/04/13 2140  Weight: 60.8 kg (134 lb 0.6 oz)    Exam:  General: Confused, but awake and alert, she has generalized tremor HEENT: anicteric sclera, pupils reactive to light and accommodation, EOMI CVS: S1-S2 clear, no  murmur rubs or gallops Chest: clear to auscultation bilaterally, no wheezing, rales or rhonchi Abdomen: soft nontender, nondistended, normal bowel sounds, no organomegaly Extremities: no cyanosis, clubbing or edema noted bilaterally Neuro: Cranial nerves II-XII intact, no focal neurological deficits  Data Reviewed: Basic Metabolic Panel:  Recent Labs Lab 02/04/13 1645 02/04/13 2208 02/05/13 0504 02/06/13 0608  NA 136  --  139 135  K 4.3  --  3.5 3.5  CL 98  --  103 100  CO2 28  --  24 22  GLUCOSE 119*  --  120* 127*  BUN 18  --  12 8  CREATININE 1.02 0.73 0.63 0.56  CALCIUM 9.9  --  8.5 9.1   Liver Function Tests: No results found for this basename: AST, ALT, ALKPHOS, BILITOT, PROT, ALBUMIN,  in the last 168 hours No results found for this basename: LIPASE, AMYLASE,  in the last 168 hours No results found for this basename: AMMONIA,  in the last 168 hours CBC:  Recent Labs Lab 02/04/13 1645 02/04/13 2208 02/05/13 0504 02/06/13 0608  WBC 18.4* 15.0* 16.8* 20.1*  NEUTROABS 14.6*  --   --   --   HGB 12.2 11.7* 11.8* 11.7*  HCT 36.8 35.5* 35.0* 34.5*  MCV 96.8 96.7 96.2 94.0  PLT 232 197 212 221   Cardiac Enzymes: No results found for this basename: CKTOTAL, CKMB, CKMBINDEX, TROPONINI,  in the last 168 hours BNP (last 3 results) No results found for this basename: PROBNP,  in  the last 8760 hours CBG: No results found for this basename: GLUCAP,  in the last 168 hours  Recent Results (from the past 240 hour(s))  URINE CULTURE     Status: None   Collection Time    02/04/13  6:34 PM      Result Value Range Status   Specimen Description URINE, RANDOM   Final   Special Requests NONE   Final   Culture  Setup Time 02/04/2013 19:55   Final   Colony Count 60,000 COLONIES/ML   Final   Culture     Final   Value: LACTOBACILLUS SPECIES     Note: Standardized susceptibility testing for this organism is not available.   Report Status 02/06/2013 FINAL   Final      Studies: Dg Chest 2 View  02/04/2013  *RADIOLOGY REPORT*  Clinical Data: Confusion and weakness.  CHEST - 2 VIEW  Comparison: Chest x-ray 01/30/2011.  Findings: Lung volumes are low.  There are linear bibasilar opacities favored to represent subsegmental atelectasis.  No definite consolidative airspace disease.  Mild diffuse interstitial prominence and peribronchial cuffing.  No pleural effusions. Pulmonary vasculature is within normal limits.  Heart size is normal.  Upper mediastinal contours are unremarkable. Atherosclerosis in the thoracic aorta.  Bilateral apical pleuroparenchymal thickening with probable pleural calcifications in the left apex, similar to prior studies, most compatible with chronic pleural parenchymal scarring.  IMPRESSION: 1.  Mild diffuse interstitial prominence and peribronchial cuffing suspicious for bronchitis. 2.  Low lung volumes with bibasilar subsegmental atelectasis. 3.  Atherosclerosis.   Original Report Authenticated By: Trudie Reed, M.D.    Ct Head Wo Contrast  02/04/2013  *RADIOLOGY REPORT*  Clinical Data: Right-sided weakness.  CT HEAD WITHOUT CONTRAST  Technique:  Contiguous axial images were obtained from the base of the skull through the vertex without contrast.  Comparison: 01/30/2011.  Findings: Stable changes of cerebral atrophy, ventriculomegaly and periventricular white matter disease.  There is a remote cortical infarct in the left posterior parietal/occipital region.  No CT findings for acute hemispheric infarction and/or intracranial hemorrhage.  No extra-axial fluid collections are identified.  No mass lesions.  The brainstem and cerebellum are grossly normal and stable.  The bony structures are intact.  The paranasal sinuses and mastoid air cells are clear.  IMPRESSION:  1.  Stable age related cerebral atrophy, ventriculomegaly and periventricular white matter disease. 2.  Remote left posterior parietal/occipital infarct. 3.  No new/acute intracranial  findings.   Original Report Authenticated By: Rudie Meyer, M.D.     Scheduled Meds: . dipyridamole-aspirin  1 capsule Oral BID  . donepezil  10 mg Oral QHS  . enoxaparin (LOVENOX) injection  40 mg Subcutaneous Q24H  . guaiFENesin  1,200 mg Oral BID  . levofloxacin (LEVAQUIN) IV  250 mg Intravenous Q24H  . levothyroxine  25 mcg Oral QAC breakfast  . primidone  50 mg Oral QHS  . simvastatin  20 mg Oral q1800   Continuous Infusions: . sodium chloride 75 mL/hr at 02/04/13 2158    Principal Problem:   Weakness generalized Active Problems:   Dementia   CAP (community acquired pneumonia)   UTI (lower urinary tract infection)   Leukocytosis   Hypothyroid   Hyperlipidemia    Time spent: 35 minutes    [facility]  [facility] Pager [phone]. If 8PM-8AM, please contact night-coverage at [email or web address], password [facility] 02/06/2013, 11:21 AM  LOS: 2 days

## 2013-02-06 NOTE — Plan of Care (Signed)
Problem: Phase I Progression Outcomes Goal: Voiding-avoid urinary catheter unless indicated Outcome: Completed/Met Date Met:  02/06/13 Patient incontinent

## 2013-02-07 LAB — BASIC METABOLIC PANEL
BUN: 11 mg/dL (ref 6–23)
CO2: 24 mEq/L (ref 19–32)
Glucose, Bld: 131 mg/dL — ABNORMAL HIGH (ref 70–99)
Potassium: 3.1 mEq/L — ABNORMAL LOW (ref 3.5–5.1)
Sodium: 136 mEq/L (ref 135–145)

## 2013-02-07 LAB — CBC
HCT: 33 % — ABNORMAL LOW (ref 36.0–46.0)
Hemoglobin: 11.2 g/dL — ABNORMAL LOW (ref 12.0–15.0)
MCH: 32 pg (ref 26.0–34.0)
MCHC: 33.9 g/dL (ref 30.0–36.0)
MCV: 94.3 fL (ref 78.0–100.0)
RBC: 3.5 MIL/uL — ABNORMAL LOW (ref 3.87–5.11)

## 2013-02-07 MED ORDER — POTASSIUM CHLORIDE CRYS ER 20 MEQ PO TBCR
60.0000 meq | EXTENDED_RELEASE_TABLET | Freq: Once | ORAL | Status: AC
  Administered 2013-02-07: 60 meq via ORAL
  Filled 2013-02-07: qty 3

## 2013-02-07 MED ORDER — POTASSIUM CHLORIDE CRYS ER 20 MEQ PO TBCR
20.0000 meq | EXTENDED_RELEASE_TABLET | Freq: Two times a day (BID) | ORAL | Status: DC
  Administered 2013-02-07: 20 meq via ORAL
  Filled 2013-02-07: qty 1

## 2013-02-07 NOTE — Progress Notes (Signed)
[facility] PROGRESS NOTE  [name] WUJ:[id number] DOB: [birth year] DOA: 02/04/2013 PCP: Pearson Grippe, MD  HPI/Subjective: Less confused. Denies pain.  Asking when she can go home.   Assessment/Plan:  Acute bronchitis -Appears improved. -Transitioned from Levaquin  to IV Rocephin and oral azithromycin on 2/23. -Supportive management with bronchodilators, mucolytics and expectorants.  UTI -Urine culture showed Lactobacillus, previous culture showed GBS. -Blood culture drawn 2/23 is pending.  Leukocytosis -WBC 18 ->16 -> 20 -> 19.5.  09/2012 WBC was 10 at PCP. -not chronic, not on steroids.  Check am CBC.  Generalized weakness -This is secondary to the pneumonia and the UTI. -PT recommends SNF. -Discussed with both Husband and daughter.  They will decide today.  ? Would this dementia patient do better in her own howm?  Tremors -Doesn't seem to be primary neurological event, likely secondary to the UTI or the pneumonia. -TSH wnl.  Per PCP patient has had tremor in hands.  It likely became worse with acute infection.  Code Status: Full code Family Communication: spoke with husband and daughter Dorene Grebe) over the telephone. Disposition Plan: Home with home health vs SNF.   Consultants:  None  Procedures:  None  Antibiotics:  Levaquin>>> 02/23  Rocephin and azithromycin started on 02/23   Objective: Filed Vitals:   02/07/13 0200 02/07/13 0523 02/07/13 1039 02/07/13 1305  BP: 161/79 160/89 152/76 129/76  Pulse: 97 96 97 106  Temp: 97.5 F (36.4 C) 97.5 F (36.4 C) 98.5 F (36.9 C) 97.6 F (36.4 C)  TempSrc: Oral Oral Oral Oral  Resp: 18 20 20 20   Height:      Weight:      SpO2: 94% 93% 97% 96%    Intake/Output Summary (Last 24 hours) at 02/07/13 1444 Last data filed at 02/07/13 1300  Gross per 24 hour  Intake   2450 ml  Output      0 ml  Net   2450 ml   Filed Weights   02/04/13 2140  Weight: 60.8 kg (134 lb 0.6 oz)    Exam:  General:   awake and alert, she has generalized tremor, appears nervous. HEENT: anicteric sclera, pupils reactive to light and accommodation, EOMI CVS: S1-S2 clear, no murmur rubs or gallops Chest: clear to auscultation bilaterally, no wheezing, rales or rhonchi, no accessory muscle movement. Abdomen: soft nontender, nondistended, normal bowel sounds, no organomegaly Extremities: no cyanosis, clubbing or edema noted bilaterally Neuro: Cranial nerves II-XII intact, no focal neurological deficits Skin:  No obvious rashes bruises or lesions.  Data Reviewed: Basic Metabolic Panel:  Recent Labs Lab 02/04/13 1645 02/04/13 2208 02/05/13 0504 02/06/13 0608 02/07/13 0654  NA 136  --  139 135 136  K 4.3  --  3.5 3.5 3.1*  CL 98  --  103 100 101  CO2 28  --  24 22 24   GLUCOSE 119*  --  120* 127* 131*  BUN 18  --  12 8 11   CREATININE 1.02 0.73 0.63 0.56 0.52  CALCIUM 9.9  --  8.5 9.1 8.6   CBC:  Recent Labs Lab 02/04/13 1645 02/04/13 2208 02/05/13 0504 02/06/13 0608 02/07/13 0654  WBC 18.4* 15.0* 16.8* 20.1* 19.5*  NEUTROABS 14.6*  --   --   --   --   HGB 12.2 11.7* 11.8* 11.7* 11.2*  HCT 36.8 35.5* 35.0* 34.5* 33.0*  MCV 96.8 96.7 96.2 94.0 94.3  PLT 232 197 212 221 226     Recent Results (from the past  240 hour(s))  URINE CULTURE     Status: None   Collection Time    02/04/13  6:34 PM      Result Value Range Status   Specimen Description URINE, RANDOM   Final   Special Requests NONE   Final   Culture  Setup Time 02/04/2013 19:55   Final   Colony Count 60,000 COLONIES/ML   Final   Culture     Final   Value: LACTOBACILLUS SPECIES     Note: Standardized susceptibility testing for this organism is not available.   Report Status 02/06/2013 FINAL   Final     Studies: No results found.  Scheduled Meds: . azithromycin  500 mg Oral Daily  . cefTRIAXone (ROCEPHIN)  IV  1 g Intravenous Q24H  . dipyridamole-aspirin  1 capsule Oral BID  . donepezil  10 mg Oral QHS  . enoxaparin  (LOVENOX) injection  40 mg Subcutaneous Q24H  . guaiFENesin  1,200 mg Oral BID  . levothyroxine  25 mcg Oral QAC breakfast  . potassium chloride  20 mEq Oral BID  . primidone  50 mg Oral QHS  . simvastatin  20 mg Oral q1800   Continuous Infusions: . sodium chloride 75 mL/hr at 02/06/13 2117    Principal Problem:   Acute bronchitis Active Problems:   Dementia   Weakness generalized   UTI (lower urinary tract infection)   Leukocytosis   Hypothyroid   Hyperlipidemia    Conley Canal  [facility] Pager [phone]. If 8PM-8AM, please contact night-coverage at [email or web address], password [facility] 02/07/2013, 2:44 PM  LOS: 3 days

## 2013-02-07 NOTE — Evaluation (Signed)
Physical Therapy Evaluation Patient Details Name: [name] MRN: [id number] DOB: Mar 31, 1927 Today's Date: 02/07/2013 Time: 4098-1191 PT Time Calculation (min): 20 min  PT Assessment / Plan / Recommendation Clinical Impression  Pt adm with PNA and UTI.  Needs skilled PT to maximize I and safety to decr burden of care so pt can eventually return home.  Currently recommend ST-SNF.    PT Assessment  Patient needs continued PT services    Follow Up Recommendations  SNF    Does the patient have the potential to tolerate intense rehabilitation      Barriers to Discharge        Equipment Recommendations  None recommended by PT    Recommendations for Other Services     Frequency Min 3X/week    Precautions / Restrictions Precautions Precautions: Fall Restrictions Weight Bearing Restrictions: No   Pertinent Vitals/Pain N/A      Mobility  Bed Mobility Bed Mobility: Supine to Sit;Sitting - Scoot to Edge of Bed Supine to Sit: 3: Mod assist;HOB elevated Sitting - Scoot to Edge of Bed: 3: Mod assist Details for Bed Mobility Assistance: assist to bring trunk up and move hips to EOB.  Pt needs cuing for initiation and technique. Transfers Transfers: Sit to Stand;Stand to Sit Sit to Stand: 3: Mod assist;With upper extremity assist;From bed Stand to Sit: 3: Mod assist;With upper extremity assist;To chair/3-in-1 Details for Transfer Assistance: Assist to bring hips up.  Had pt put hands on walker to assist with initiating sit to stand. Ambulation/Gait Ambulation/Gait Assistance: 3: Mod assist Ambulation Distance (Feet): 6 Feet Assistive device: Rolling walker Ambulation/Gait Assistance Details: Verbal/tactile cues to initiate stepping. Pt with posterior lean. Verbal cues to stand more erect. Gait Pattern: Step-to pattern;Decreased step length - left;Decreased step length - right;Trunk flexed;Shuffle Gait velocity: decr    Exercises     PT Diagnosis: Difficulty  walking;Generalized weakness  PT Problem List: Decreased strength;Decreased balance;Decreased mobility;Decreased knowledge of use of DME;Decreased knowledge of precautions PT Treatment Interventions: DME instruction;Gait training;Patient/family education;Functional mobility training;Therapeutic activities;Therapeutic exercise;Balance training   PT Goals Acute Rehab PT Goals PT Goal Formulation: Patient unable to participate in goal setting Time For Goal Achievement: 02/14/13 Potential to Achieve Goals: Good Pt will go Supine/Side to Sit: with min assist PT Goal: Supine/Side to Sit - Progress: Goal set today Pt will go Sit to Supine/Side: with min assist PT Goal: Sit to Supine/Side - Progress: Goal set today Pt will go Sit to Stand: with min assist PT Goal: Sit to Stand - Progress: Goal set today Pt will go Stand to Sit: with supervision PT Goal: Stand to Sit - Progress: Goal set today Pt will Ambulate: 16 - 50 feet;with min assist;with least restrictive assistive device PT Goal: Ambulate - Progress: Goal set today  Visit Information  Last PT Received On: 02/07/13 Assistance Needed: +1    Subjective Data  Subjective: "I guess we have two," pt answered about how many stories her house is. Patient Stated Goal: Pt unable to state.   Prior Functioning  Home Living Lives With: Spouse Available Help at Discharge: Family Additional Comments: Pt unable to state. Prior Function Comments: Pt unable to state.    Cognition  Cognition Overall Cognitive Status: No family/caregiver present to determine baseline cognitive functioning Area of Impairment: Memory Arousal/Alertness: Awake/alert Orientation Level: Disoriented to;Place;Time;Situation Behavior During Session: WFL for tasks performed Memory Deficits: Unable to recall household situation    Extremity/Trunk Assessment Right Lower Extremity Assessment RLE ROM/Strength/Tone: Deficits RLE ROM/Strength/Tone Deficits:  grossly  3+/5 Left Lower Extremity Assessment LLE ROM/Strength/Tone: Deficits LLE ROM/Strength/Tone Deficits: grossly 3+/5   Balance Balance Balance Assessed: Yes Static Standing Balance Static Standing - Balance Support: Bilateral upper extremity supported (on walker) Static Standing - Level of Assistance: 4: Min assist  End of Session PT - End of Session Equipment Utilized During Treatment: Gait belt Activity Tolerance: Patient tolerated treatment well Patient left: in chair;with call bell/phone within reach;with nursing in room Nurse Communication: Mobility status  GP     [name] 02/07/2013, 10:10 AM  [employer] PT [phone]

## 2013-02-07 NOTE — Progress Notes (Signed)
Addendum  Patient seen and examined, chart and data base reviewed.  I agree with the above assessment and plan.  For full details please see Mrs. Algis Downs PA note.  Acute bronchitis and UTI presented as generalized weakness and tremors.  Although patient clinically is improving, her WBC is worsening, check CBC in a.m.   Clint Lipps, MD [facility] Pager: [phone] 02/07/2013, 4:34 PM

## 2013-02-08 LAB — CBC
HCT: 32.7 % — ABNORMAL LOW (ref 36.0–46.0)
MCH: 32 pg (ref 26.0–34.0)
MCHC: 34.3 g/dL (ref 30.0–36.0)
MCV: 93.4 fL (ref 78.0–100.0)
Platelets: 242 10*3/uL (ref 150–400)
RDW: 13.1 % (ref 11.5–15.5)

## 2013-02-08 LAB — BASIC METABOLIC PANEL
BUN: 10 mg/dL (ref 6–23)
Calcium: 8.8 mg/dL (ref 8.4–10.5)
Chloride: 103 mEq/L (ref 96–112)
Creatinine, Ser: 0.54 mg/dL (ref 0.50–1.10)
GFR calc Af Amer: >90 mL/min (ref >90)

## 2013-02-08 MED ORDER — AZITHROMYCIN 250 MG PO TABS: 250.0000 mg | ORAL_TABLET | Freq: Every day | ORAL | Status: AC

## 2013-02-08 MED ORDER — CEFUROXIME AXETIL 500 MG PO TABS: 500.0000 mg | ORAL_TABLET | Freq: Two times a day (BID) | ORAL | Status: AC

## 2013-02-08 NOTE — Care Management Note (Addendum)
    Page 1 of 2   02/08/2013     12:03:23 PM   CARE MANAGEMENT NOTE 02/08/2013  Patient:  [name], [name]   Account Number:  [id number]  Date Initiated:  02/08/2013  Documentation initiated by:  Letha Cape  Subjective/Objective Assessment:   dx acute bronchitis  admit- lives with spouse.     Action/Plan:   pt eval - rec snf.   Anticipated DC Date:  02/08/2013   Anticipated DC Plan:  SKILLED NURSING FACILITY  In-house referral  Clinical Social Worker      DC Associate Professor  CM consult      [facility]  HOME HEALTH   Choice offered to / List presented to:  C-1 Patient        HH arranged  HH-1 RN  HH-2 PT  HH-3 OT  HH-4 NURSE'S AIDE  HH-6 SOCIAL WORKER      HH agency  [facility]   Status of service:  Completed, signed off Medicare Important Message given?   (If response is "NO", the following Medicare IM given date fields will be blank) Date Medicare IM given:   Date Additional Medicare IM given:    Discharge Disposition:  HOME W HOME HEALTH SERVICES  Per UR Regulation:  Reviewed for med. necessity/level of care/duration of stay  If discussed at Long Length of Stay Meetings, dates discussed:    Comments:  02/08/13 10:52 Letha Cape RN, BSN [phone] patient lives with spouse, per physical therapy recs snf, CSW following.  Patient 's spouse has decided that patient will be going home with [facility] services, they are refusing snf again.  Spouse chose [facility] from agency list for [facility], PT, OT, aide and CSW.  Referral made to [facility], Marie notified.  Soc will begin 24- 48 hrs post discharge.

## 2013-02-08 NOTE — Progress Notes (Signed)
1600 Patient discharge to [facility] transported by car with husband. Skin  Slight redden to sacral.Package given to husband.Left floor in wheelchair accompanied by staff and husband. Report called to nurse Don,LPN at [facility]

## 2013-02-08 NOTE — Progress Notes (Signed)
Physical Therapy Treatment Patient Details Name: [name] MRN: [id number] DOB: 06-03-1927 Today's Date: 02/08/2013 Time: 3244-0102 PT Time Calculation (min): 30 min  PT Assessment / Plan / Recommendation Comments on Treatment Session  Pt adm with UTI and has dementia.  Pt continues to have poor mobility.    Follow Up Recommendations  SNF     Does the patient have the potential to tolerate intense rehabilitation     Barriers to Discharge        Equipment Recommendations  None recommended by PT    Recommendations for Other Services    Frequency Min 3X/week   Plan Discharge plan remains appropriate;Frequency remains appropriate    Precautions / Restrictions Precautions Precautions: Fall   Pertinent Vitals/Pain N/A    Mobility  Bed Mobility Supine to Sit: 3: Mod assist Sitting - Scoot to Edge of Bed: 3: Mod assist Details for Bed Mobility Assistance: Pt needs step by step cues. Assist to bring trunk up. Transfers Sit to Stand: 3: Mod assist;With upper extremity assist;From bed;From chair/3-in-1;With armrests Stand to Sit: 3: Mod assist;With upper extremity assist;To chair/3-in-1;To bed Details for Transfer Assistance: Assist to bring hips up.  Verbal tactile cues to extend hips and knees. Ambulation/Gait Ambulation/Gait Assistance: 3: Mod assist Ambulation Distance (Feet): 3 Feet Assistive device: Rolling walker Ambulation/Gait Assistance Details: Pt with posterior lean. Hips and knees flexed. Gait Pattern: Step-to pattern;Decreased step length - right;Decreased step length - left;Shuffle;Trunk flexed Gait velocity: decr    Exercises General Exercises - Upper Extremity Shoulder Flexion: AAROM;Both;10 reps;Supine General Exercises - Lower Extremity Ankle Circles/Pumps: Strengthening;Both;10 reps;Supine Heel Slides: Strengthening;Both;10 reps;Supine Hip ABduction/ADduction: Strengthening;Both;10 reps;Supine   PT Diagnosis:    PT Problem List:   PT Treatment  Interventions:     PT Goals Acute Rehab PT Goals PT Goal: Supine/Side to Sit - Progress: Progressing toward goal PT Goal: Sit to Stand - Progress: Not progressing PT Goal: Stand to Sit - Progress: Not progressing PT Goal: Ambulate - Progress: Not progressing  Visit Information  Last PT Received On: 02/08/13 Assistance Needed: +2 (helpful to progress mobility)    Subjective Data  Subjective: "Yes," pt stated when asked if she had a good breakfast.   Cognition  Cognition Overall Cognitive Status: No family/caregiver present to determine baseline cognitive functioning Area of Impairment: Following commands Arousal/Alertness: Awake/alert Orientation Level: Disoriented to;Place;Time;Situation Behavior During Session: [facility] for tasks performed Memory Deficits: Repeated directions needed Following Commands: Follows one step commands consistently Cognition - Other Comments: Slow to respond    Balance  Static Standing Balance Static Standing - Balance Support: Bilateral upper extremity supported (on walker) Static Standing - Level of Assistance: 3: Mod assist  End of Session PT - End of Session Activity Tolerance: Other (comment) (Pt incontinent of urine and stool.) Patient left: in chair;with call bell/phone within reach Nurse Communication: Mobility status   GP     [facility] 02/08/2013, 9:51 AM  [facility] PT [phone]

## 2013-02-08 NOTE — Discharge Summary (Signed)
Physician Discharge Summary  TERRYL NIZIOLEK ZOX:[id number] DOB: 1927-03-09 DOA: 02/04/2013  PCP: Pearson Grippe, MD  Admit date: 02/04/2013 Discharge date: 02/08/2013  Time spent: 40 minutes  Recommendations for Outpatient Follow-up:  1. Followup with primary care physician  Discharge Diagnoses:  Principal Problem:   Acute bronchitis Active Problems:   Dementia   Weakness generalized   UTI (lower urinary tract infection)   Leukocytosis   Hypothyroid   Hyperlipidemia   Discharge Condition: Stable  Diet recommendation: Heart healthy diet  Filed Weights   02/04/13 2140  Weight: 60.8 kg (134 lb 0.6 oz)    History of present illness:  [name] is a 79 y.o. female with a history of Dementia who was brought to the ED due to gradual weakness over the past 2 weeks, and today she had difficulty walking and grasping objects. Her husband gives the history and reports that this AM she also had shaking and was tremulous especially in her right hand. He states that she has had nasal congestion, but he does not know if she had any fevers or chills. In the ED she was found to have a leukocytosis of 18k, and a Chest x-ray which revealed bilateral atelectasis, and a positive UA. She was started on IV Levaquin to cover both.   Hospital Course:   1. Acute bronchitis: Patient brought to the hospital by her family because of generalized weakness, patient got very tremulous, and she has exacerbation of her dementia with more confusion and lethargic. After admission to the hospital patient was started on Levaquin, she did very well without any fever or chills. Her white blood cell count was increasing so the antibiotics were switched basically to Rocephin and azithromycin. On the time of discharge Ceftin and azithromycin for 5 more days for the acute bronchitis.  2. UTI: Urinalysis was positive for UTI with multiple pus cells and leukocyte esterase the time of admission, the urine culture showed  Lactobacillus, she had recent previous culture showed group B Streptococcus. Urine culture drawn on 02/06/13 and did not show any growth. Antibiotics to be continued for 5 more days.  3. Leukocytosis: WBC count was 18,000 on admission, it went up to 20,000, today is 18.8. Patient is still has no fever or chills, her generalized weakness improved since admission. Baseline WBC was 12.1 on February 16. With neutrophilia, this should be followed as outpatient to ensure resolution.  4. Tremors: Does not seem to the primary neurological event, likely patient has history of essential tremor and that exacerbated by the UTI or the acute bronchitis., TSH within normal limits. Her previous PCP notes, patient did have history of tremor.  5. Generalized weakness: This is secondary to the acute infection both the UTI and acute bronchitis, PT evaluated the patient recommended skilled nursing facility. This is discussed with both her husband and her daughter, patient for SNF.  Procedures:  None  Consultations:  None  Discharge Exam: Filed Vitals:   02/08/13 0058 02/08/13 0153 02/08/13 0719 02/08/13 0956  BP: 136/68 134/72 175/75 145/65  Pulse:  97 96 98  Temp:  98.6 F (37 C) 98.5 F (36.9 C) 98.3 F (36.8 C)  TempSrc:  Oral Oral Oral  Resp:  16 18 20   Height:      Weight:      SpO2:  94% 94% 95%   General: Alert and awake, disoriented, not in any acute distress. HEENT: anicteric sclera, pupils reactive to light and accommodation, EOMI CVS: S1-S2 clear, no murmur  rubs or gallops Chest: clear to auscultation bilaterally, no wheezing, rales or rhonchi Abdomen: soft nontender, nondistended, normal bowel sounds, no organomegaly Extremities: no cyanosis, clubbing or edema noted bilaterally Neuro: Cranial nerves II-XII intact, no focal neurological deficits   Discharge Instructions  Discharge Orders   Future Orders Complete By Expires     Increase activity slowly  As directed          Medication List    TAKE these medications       azithromycin 250 MG tablet  Commonly known as:  ZITHROMAX  Take 1 tablet (250 mg total) by mouth daily.     cefUROXime 500 MG tablet  Commonly known as:  CEFTIN  Take 1 tablet (500 mg total) by mouth 2 (two) times daily.     dipyridamole-aspirin 200-25 MG per 12 hr capsule  Commonly known as:  AGGRENOX  Take 1 capsule by mouth 2 (two) times daily.     donepezil 10 MG tablet  Commonly known as:  ARICEPT  Take 10 mg by mouth at bedtime.     levothyroxine 25 MCG tablet  Commonly known as:  SYNTHROID, LEVOTHROID  Take 25 mcg by mouth every morning.     primidone 50 MG tablet  Commonly known as:  MYSOLINE  Take 50 mg by mouth every evening.     simvastatin 20 MG tablet  Commonly known as:  ZOCOR  Take 20 mg by mouth every evening.          The results of significant diagnostics from this hospitalization (including imaging, microbiology, ancillary and laboratory) are listed below for reference.    Significant Diagnostic Studies: Dg Chest 2 View  02/04/2013  *RADIOLOGY REPORT*  Clinical Data: Confusion and weakness.  CHEST - 2 VIEW  Comparison: Chest x-ray 01/30/2011.  Findings: Lung volumes are low.  There are linear bibasilar opacities favored to represent subsegmental atelectasis.  No definite consolidative airspace disease.  Mild diffuse interstitial prominence and peribronchial cuffing.  No pleural effusions. Pulmonary vasculature is within normal limits.  Heart size is normal.  Upper mediastinal contours are unremarkable. Atherosclerosis in the thoracic aorta.  Bilateral apical pleuroparenchymal thickening with probable pleural calcifications in the left apex, similar to prior studies, most compatible with chronic pleural parenchymal scarring.  IMPRESSION: 1.  Mild diffuse interstitial prominence and peribronchial cuffing suspicious for bronchitis. 2.  Low lung volumes with bibasilar subsegmental atelectasis. 3.   Atherosclerosis.   Original Report Authenticated By: Trudie Reed, M.D.    Ct Head Wo Contrast  02/04/2013  *RADIOLOGY REPORT*  Clinical Data: Right-sided weakness.  CT HEAD WITHOUT CONTRAST  Technique:  Contiguous axial images were obtained from the base of the skull through the vertex without contrast.  Comparison: 01/30/2011.  Findings: Stable changes of cerebral atrophy, ventriculomegaly and periventricular white matter disease.  There is a remote cortical infarct in the left posterior parietal/occipital region.  No CT findings for acute hemispheric infarction and/or intracranial hemorrhage.  No extra-axial fluid collections are identified.  No mass lesions.  The brainstem and cerebellum are grossly normal and stable.  The bony structures are intact.  The paranasal sinuses and mastoid air cells are clear.  IMPRESSION:  1.  Stable age related cerebral atrophy, ventriculomegaly and periventricular white matter disease. 2.  Remote left posterior parietal/occipital infarct. 3.  No new/acute intracranial findings.   Original Report Authenticated By: Rudie Meyer, M.D.     Microbiology: Recent Results (from the past 240 hour(s))  URINE CULTURE  Status: None   Collection Time    02/04/13  6:34 PM      Result Value Range Status   Specimen Description URINE, RANDOM   Final   Special Requests NONE   Final   Culture  Setup Time 02/04/2013 19:55   Final   Colony Count 60,000 COLONIES/ML   Final   Culture     Final   Value: LACTOBACILLUS SPECIES     Note: Standardized susceptibility testing for this organism is not available.   Report Status 02/06/2013 FINAL   Final  CULTURE, BLOOD (ROUTINE X 2)     Status: None   Collection Time    02/06/13 12:00 PM      Result Value Range Status   Specimen Description BLOOD LEFT ARM   Final   Special Requests BOTTLES DRAWN AEROBIC ONLY 7CC   Final   Culture  Setup Time 02/07/2013 15:43   Final   Culture     Final   Value:        BLOOD CULTURE RECEIVED NO  GROWTH TO DATE CULTURE WILL BE HELD FOR 5 DAYS BEFORE ISSUING A FINAL NEGATIVE REPORT   Report Status PENDING   Incomplete  CULTURE, BLOOD (ROUTINE X 2)     Status: None   Collection Time    02/06/13 12:06 PM      Result Value Range Status   Specimen Description BLOOD LEFT ARM   Final   Special Requests BOTTLES DRAWN AEROBIC AND ANAEROBIC 10CC   Final   Culture  Setup Time 02/07/2013 15:43   Final   Culture     Final   Value:        BLOOD CULTURE RECEIVED NO GROWTH TO DATE CULTURE WILL BE HELD FOR 5 DAYS BEFORE ISSUING A FINAL NEGATIVE REPORT   Report Status PENDING   Incomplete     Labs: Basic Metabolic Panel:  Recent Labs Lab 02/04/13 1645 02/04/13 2208 02/05/13 0504 02/06/13 0608 02/07/13 0654 02/08/13 0650  NA 136  --  139 135 136 138  K 4.3  --  3.5 3.5 3.1* 3.6  CL 98  --  103 100 101 103  CO2 28  --  24 22 24 25   GLUCOSE 119*  --  120* 127* 131* 139*  BUN 18  --  12 8 11 10   CREATININE 1.02 0.73 0.63 0.56 0.52 0.54  CALCIUM 9.9  --  8.5 9.1 8.6 8.8   Liver Function Tests: No results found for this basename: AST, ALT, ALKPHOS, BILITOT, PROT, ALBUMIN,  in the last 168 hours No results found for this basename: LIPASE, AMYLASE,  in the last 168 hours No results found for this basename: AMMONIA,  in the last 168 hours CBC:  Recent Labs Lab 02/04/13 1645 02/04/13 2208 02/05/13 0504 02/06/13 0608 02/07/13 0654 02/08/13 0650  WBC 18.4* 15.0* 16.8* 20.1* 19.5* 18.8*  NEUTROABS 14.6*  --   --   --   --   --   HGB 12.2 11.7* 11.8* 11.7* 11.2* 11.2*  HCT 36.8 35.5* 35.0* 34.5* 33.0* 32.7*  MCV 96.8 96.7 96.2 94.0 94.3 93.4  PLT 232 197 212 221 226 242   Cardiac Enzymes: No results found for this basename: CKTOTAL, CKMB, CKMBINDEX, TROPONINI,  in the last 168 hours BNP: BNP (last 3 results) No results found for this basename: PROBNP,  in the last 8760 hours CBG: No results found for this basename: GLUCAP,  in the last 168 hours  Signed:  Michele Judy  A  [facility] 02/08/2013, 11:12 AM

## 2013-02-08 NOTE — Clinical Social Work Placement (Addendum)
Clinical Social Work Department CLINICAL SOCIAL WORK PLACEMENT NOTE 02/08/2013  Patient:  [name], [name]  Account Number:  [id number] Admit date:  02/04/2013  Clinical Social Worker:  Johnsie Cancel  Date/time:  02/08/2013 02:43 PM  Clinical Social Work is seeking post-discharge placement for this patient at the following level of care:   SKILLED NURSING   (*CSW will update this form in Epic as items are completed)   02/08/2013  Patient/family provided with [facility] Department of Clinical Social Work's list of facilities offering this level of care within the geographic area requested by the patient (or if unable, by the patient's family).  02/08/2013  Patient/family informed of their freedom to choose among providers that offer the needed level of care, that participate in Medicare, Medicaid or managed care program needed by the patient, have an available bed and are willing to accept the patient.  02/08/2013  Patient/family informed of [facility]' ownership interest in [facility], as well as of the fact that they are under no obligation to receive care at this facility.  PASARR submitted to E[employer] on 02/08/2013 PASARR number received from [employer] on 02/08/2013  FL2 transmitted to all facilities in geographic area requested by pt/family on  02/08/2013 FL2 transmitted to all facilities within larger geographic area on N/A  Patient informed that his/her managed care company has contracts with or will negotiate with  certain facilities, including the following:     Patient/family informed of bed offers received:  02/08/2013 Patient chooses bed at [facility] Physician recommends and patient chooses bed at  N/A  Patient to be transferred to [facility] on 02/08/2013   Patient to be transferred to facility by Patient's spouse, [name]  The following physician request were entered in Epic:   Additional Comments:  Lia Foyer, LCSWA [facility] Clinical Social Worker Contact #: [phone]

## 2013-02-08 NOTE — Clinical Social Work Psychosocial (Signed)
Clinical Social Work Department BRIEF PSYCHOSOCIAL ASSESSMENT 02/08/2013  Patient:  [name], [name]     Account Number:  [id number]     Admit date:  02/04/2013  Clinical Social Worker:  Johnsie Cancel  Date/Time:  02/08/2013 02:16 PM  Referred by:  Physician  Date Referred:  02/07/2013 Referred for  Psychosocial assessment   Other Referral:   Interview type:  Family Other interview type:   Son    PSYCHOSOCIAL DATA Living Status:  HUSBAND Primary support name:  Leonette Most Primary support relationship to patient:  SPOUSE Degree of support available:   Adequate, at patient's bedside.    CURRENT CONCERNS Current Concerns  Post-Acute Placement   Other Concerns:    SOCIAL WORK ASSESSMENT / PLAN CSW consulted re: SNF placement. CSW met with the patient's spouse at bedside. Patient's spouse stated he was agreeable to SNF and would like to speak to his daughter. The patient said he preferred [facility]. CSW will fax the patient out to [facility].   Assessment/plan status:  Information/Referral to [facility] Other assessment/ plan:   Information/referral to community resources:   SNF List    PATIENT'S/FAMILY'S RESPONSE TO PLAN OF CARE: Patient's spouse thanked CSW for assisting in d/c.    Lia Foyer, LCSWA [facility] Clinical Social Worker Contact #: [phone]

## 2013-02-09 NOTE — Clinical Social Work Note (Addendum)
CSW was consulted to complete discharge of patient. Pt to transfer to [facility] today via spouse, Leonette Most. Facility and family are aware of d/c. D/C packet complete with chart copy, signed FL2, and signed hard Rx.  CSW signing off as no other CSW needs identified at this time.  Lia Foyer, LCSWA [facility] Clinical Social Worker Contact #: [phone]

## 2013-02-13 LAB — CULTURE, BLOOD (ROUTINE X 2): Culture: NO GROWTH

## 2014-01-15 DEATH — deceased

## 2014-01-25 ENCOUNTER — Telehealth: Payer: Self-pay

## 2014-01-25 NOTE — Telephone Encounter (Signed)
Patient past away per Obituary in [place] [employer] °

## 2014-08-06 IMAGING — CT CT HEAD W/O CM
1 of 2 series · 13 of 30 positions shown, 17 images · non-contrast
Comparison: 01/30/2011.

CLINICAL DATA: Right-sided weakness.

CT HEAD WITHOUT CONTRAST
TECHNIQUE: Contiguous axial images were obtained from the base of
the skull through the vertex without contrast.

[Series 2: brain · axial · 0.47mm/px · z∈[+131,+264]mm · 13 of 28 slices shown, 17 images]
[im 2/28  brain]
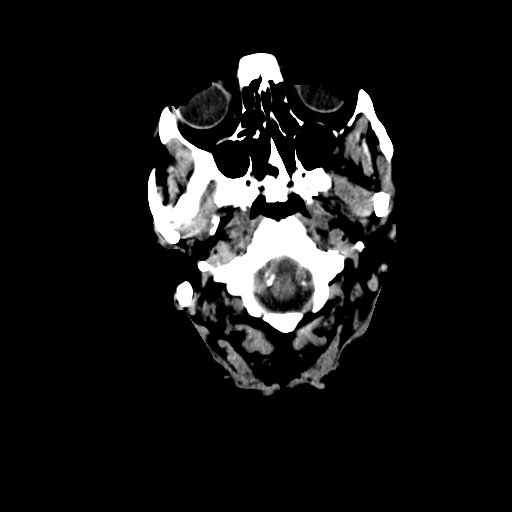
[im 2/28  bone]
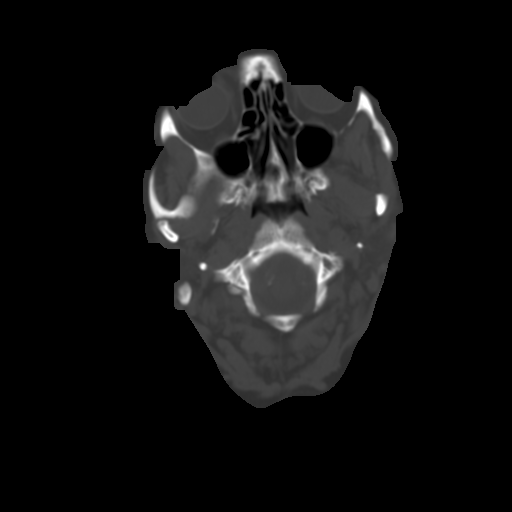
[im 4/28  brain]
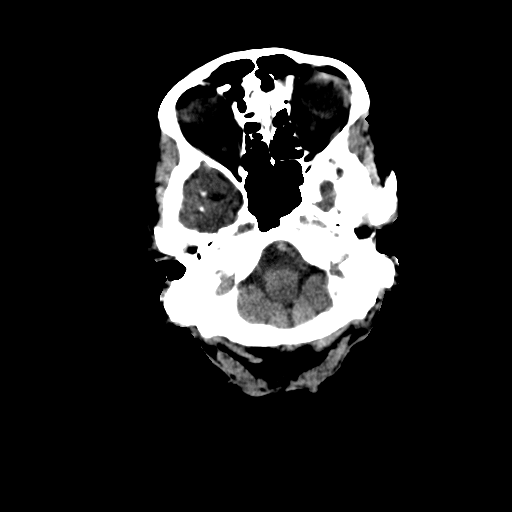
[im 6/28  brain]
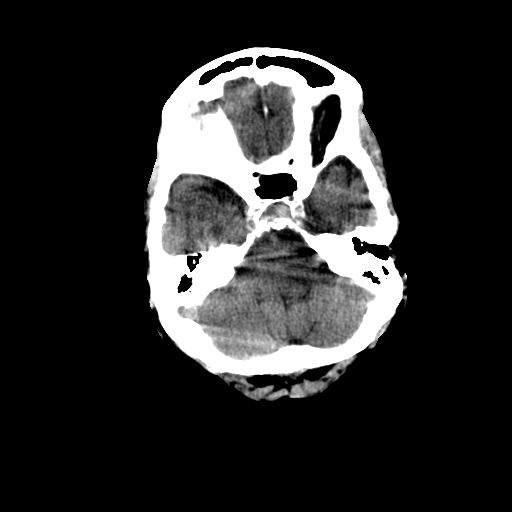
[im 8/28  brain]
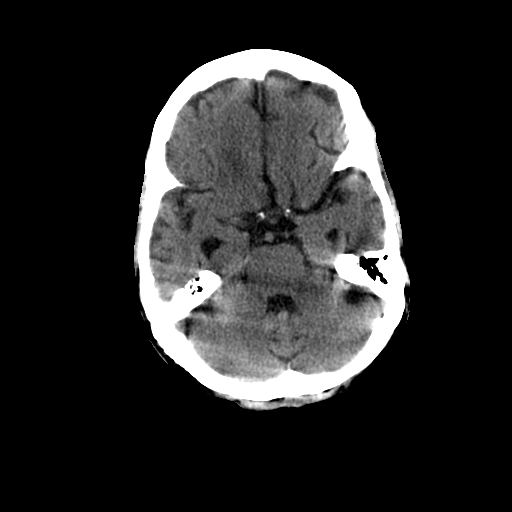
[im 10/28  brain]
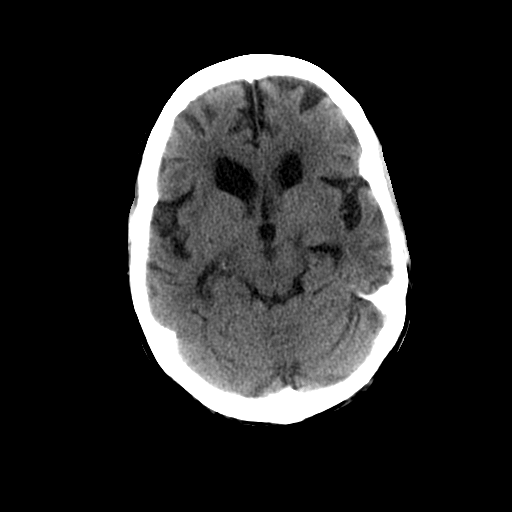
[im 10/28  bone]
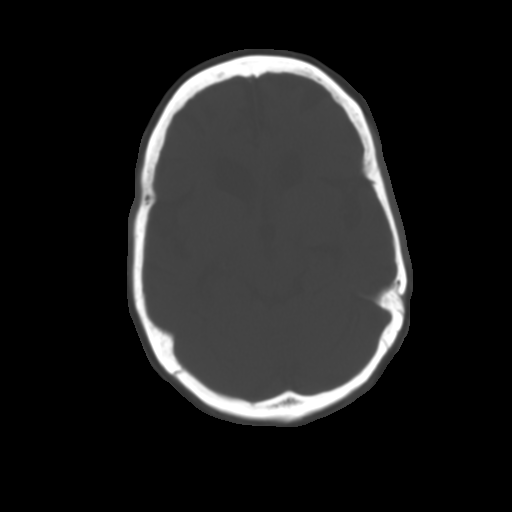
[im 12/28  brain]
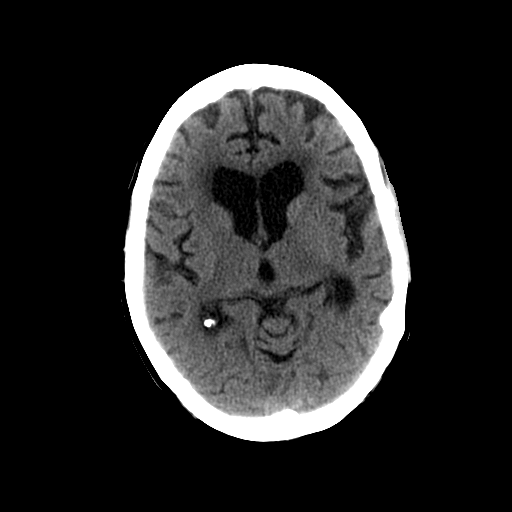
[im 14/28  brain]
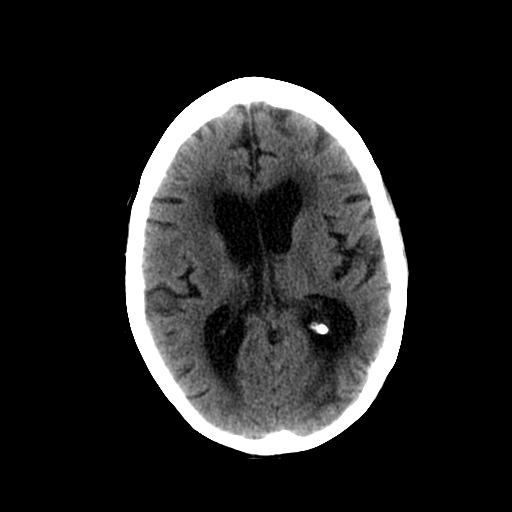
[im 16/28  brain]
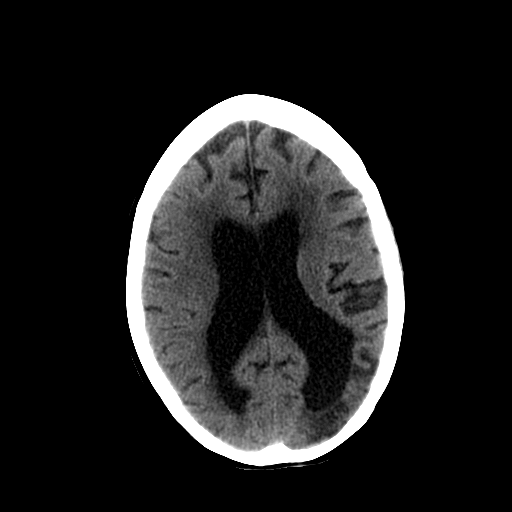
[im 18/28  brain]
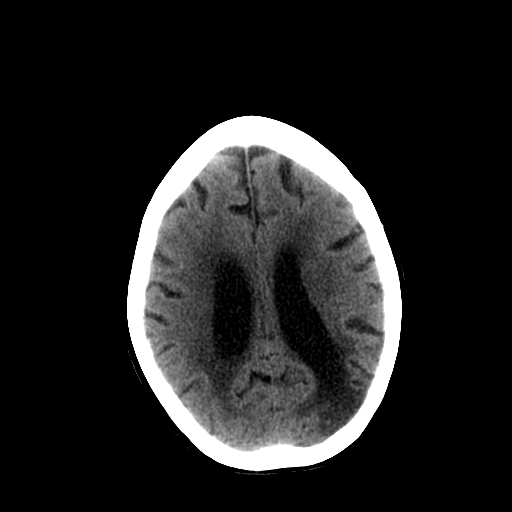
[im 18/28  bone]
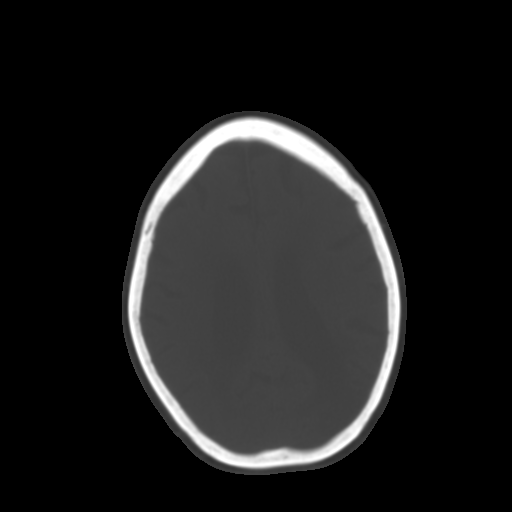
[im 20/28  brain]
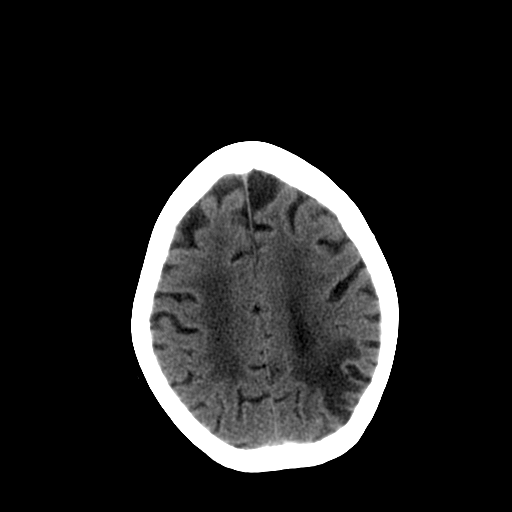
[im 22/28  brain]
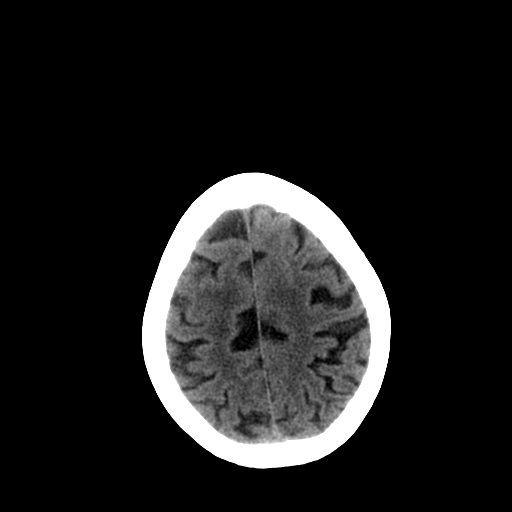
[im 24/28  brain]
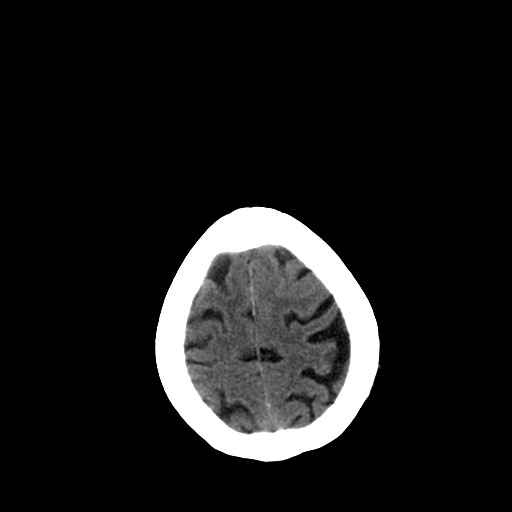
[im 26/28  brain]
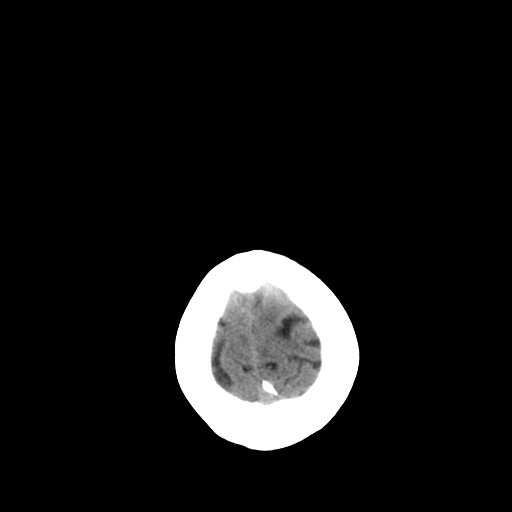
[im 26/28  bone]
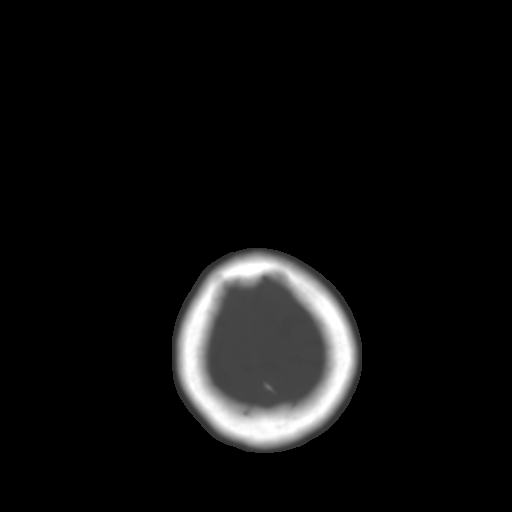

[13 of 30 positions shown; findings below may reference images not displayed]

FINDINGS: Stable changes of cerebral atrophy, ventriculomegaly and
periventricular white matter disease.  There is a remote cortical
infarct in the left posterior parietal/occipital region.  No CT
findings for acute hemispheric infarction and/or intracranial
hemorrhage.  No extra-axial fluid collections are identified.  No
mass lesions.  The brainstem and cerebellum are grossly normal and
stable.

The bony structures are intact.  The paranasal sinuses and mastoid
air cells are clear.
IMPRESSION: 1.  Stable age related cerebral atrophy, ventriculomegaly and
periventricular white matter disease.
2.  Remote left posterior parietal/occipital infarct.
3.  No new/acute intracranial findings.

## 2014-08-06 IMAGING — CR DG CHEST 2V
1 series · 1 of 1 positions shown · non-contrast
Comparison: Chest x-ray 01/30/2011.

CLINICAL DATA: Confusion and weakness.

CHEST - 2 VIEW

[view not recorded]
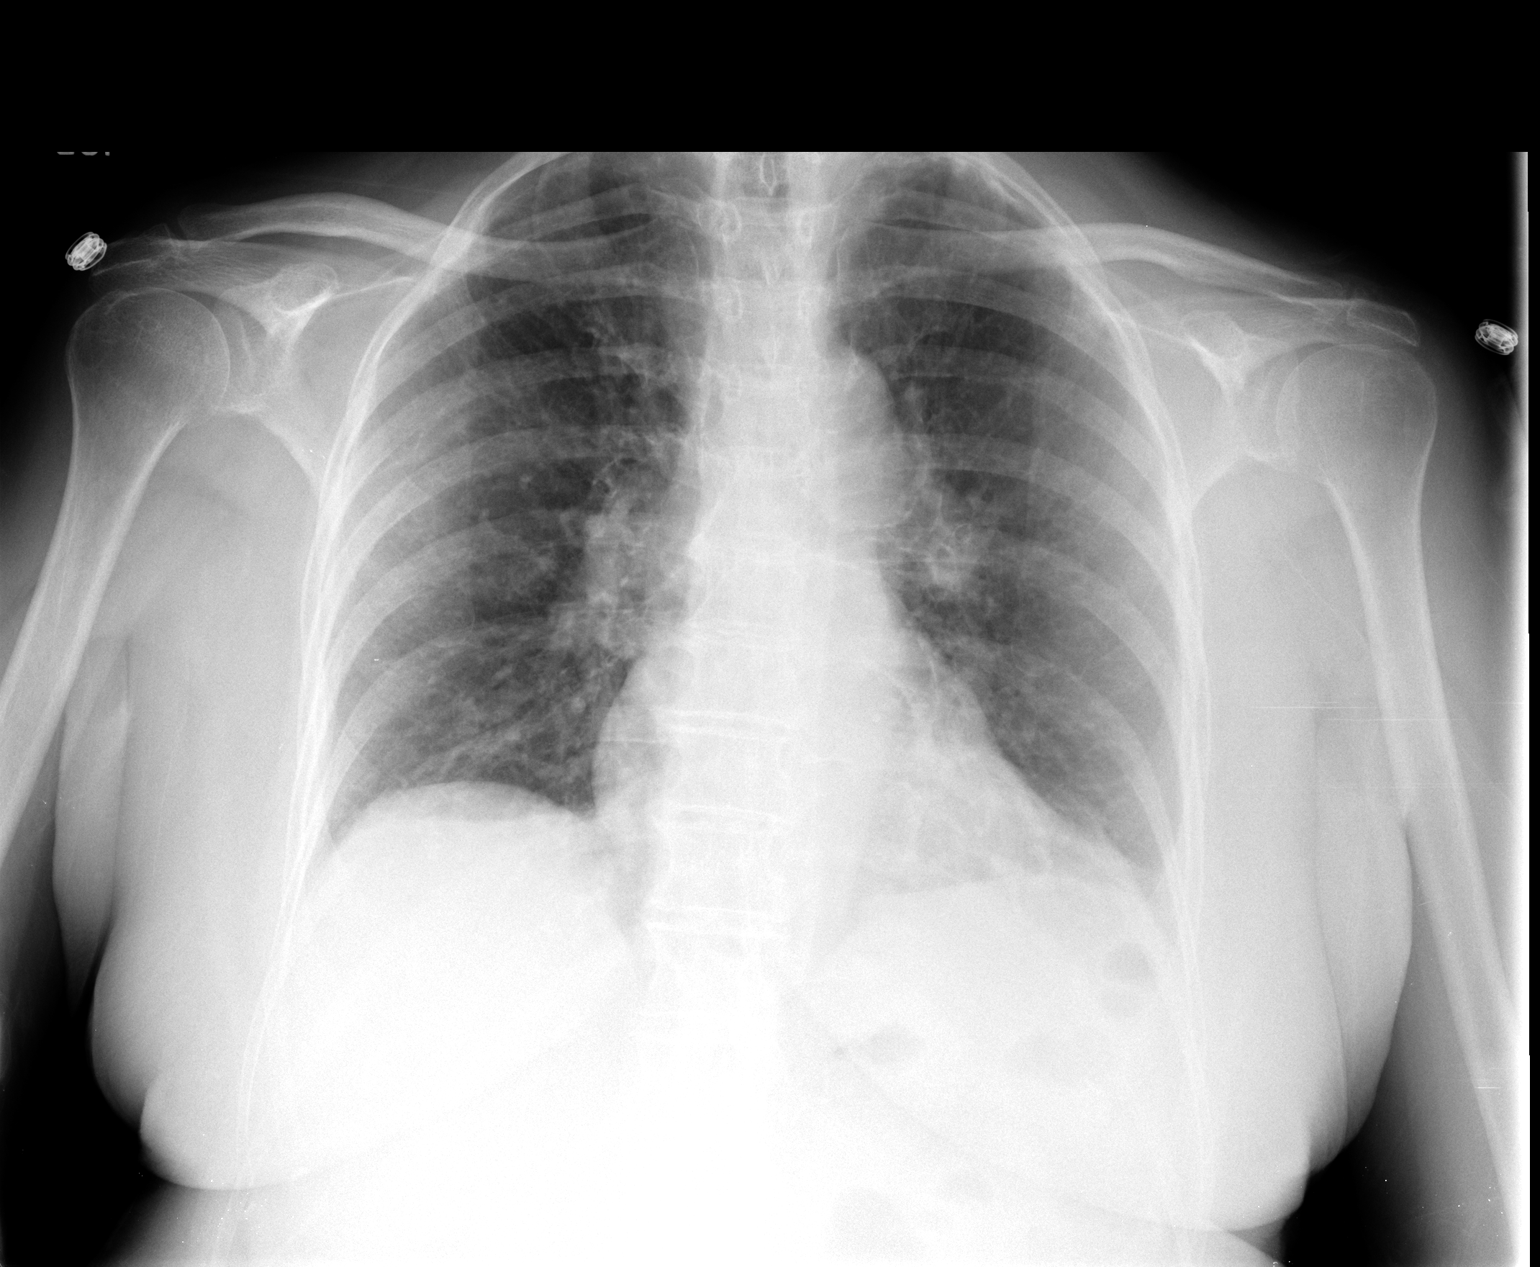

[1 of 1 positions shown; findings below may reference images not displayed]

FINDINGS: Lung volumes are low.  There are linear bibasilar
opacities favored to represent subsegmental atelectasis.  No
definite consolidative airspace disease.  Mild diffuse interstitial
prominence and peribronchial cuffing.  No pleural effusions.
Pulmonary vasculature is within normal limits.  Heart size is
normal.  Upper mediastinal contours are unremarkable.
Atherosclerosis in the thoracic aorta.  Bilateral apical
pleuroparenchymal thickening with probable pleural calcifications
in the left apex, similar to prior studies, most compatible with
chronic pleural parenchymal scarring.
IMPRESSION: 1.  Mild diffuse interstitial prominence and peribronchial cuffing
suspicious for bronchitis.
2.  Low lung volumes with bibasilar subsegmental atelectasis.
3.  Atherosclerosis.
